# Patient Record
Sex: Female | Born: 1975 | Race: Black or African American | Hispanic: No | Marital: Single | Smoking: Former smoker
Health system: Southern US, Community
[De-identification: ages and names within clinical notes are randomized; demographics above are authoritative.]

## PROBLEM LIST (undated history)

## (undated) DIAGNOSIS — J45909 Unspecified asthma, uncomplicated: Secondary | ICD-10-CM

## (undated) DIAGNOSIS — IMO0002 Reserved for concepts with insufficient information to code with codable children: Secondary | ICD-10-CM

## (undated) DIAGNOSIS — I10 Essential (primary) hypertension: Secondary | ICD-10-CM

## (undated) DIAGNOSIS — I8390 Asymptomatic varicose veins of unspecified lower extremity: Secondary | ICD-10-CM

## (undated) DIAGNOSIS — R51 Headache: Secondary | ICD-10-CM

## (undated) HISTORY — DX: Reserved for concepts with insufficient information to code with codable children: IMO0002

## (undated) HISTORY — DX: Essential (primary) hypertension: I10

## (undated) HISTORY — PX: GANGLION CYST EXCISION: SHX1691

## (undated) HISTORY — PX: ABDOMINAL HYSTERECTOMY: SHX81

## (undated) HISTORY — DX: Asymptomatic varicose veins of unspecified lower extremity: I83.90

---

## 2011-10-16 ENCOUNTER — Other Ambulatory Visit: Payer: Self-pay

## 2012-10-15 DIAGNOSIS — R87619 Unspecified abnormal cytological findings in specimens from cervix uteri: Secondary | ICD-10-CM

## 2012-10-15 DIAGNOSIS — IMO0002 Reserved for concepts with insufficient information to code with codable children: Secondary | ICD-10-CM

## 2012-10-15 HISTORY — DX: Unspecified abnormal cytological findings in specimens from cervix uteri: R87.619

## 2012-10-15 HISTORY — DX: Reserved for concepts with insufficient information to code with codable children: IMO0002

## 2012-11-06 ENCOUNTER — Other Ambulatory Visit (HOSPITAL_COMMUNITY): Payer: Self-pay | Admitting: Obstetrics and Gynecology

## 2012-11-06 DIAGNOSIS — O09529 Supervision of elderly multigravida, unspecified trimester: Secondary | ICD-10-CM

## 2012-12-01 ENCOUNTER — Encounter (HOSPITAL_COMMUNITY): Payer: Self-pay | Admitting: Obstetrics and Gynecology

## 2012-12-08 ENCOUNTER — Ambulatory Visit (HOSPITAL_COMMUNITY)
Admission: RE | Admit: 2012-12-08 | Discharge: 2012-12-08 | Disposition: A | Discharge status: Home / Self Care | Payer: Medicaid Other | Source: Ambulatory Visit | Attending: Obstetrics and Gynecology | Admitting: Obstetrics and Gynecology

## 2012-12-08 ENCOUNTER — Encounter (HOSPITAL_COMMUNITY): Payer: Self-pay

## 2012-12-08 ENCOUNTER — Other Ambulatory Visit (HOSPITAL_COMMUNITY): Payer: Self-pay | Admitting: Obstetrics and Gynecology

## 2012-12-08 VITALS — BP 128/79 | HR 79 | Wt 242.8 lb

## 2012-12-08 DIAGNOSIS — O09522 Supervision of elderly multigravida, second trimester: Secondary | ICD-10-CM

## 2012-12-08 DIAGNOSIS — O09529 Supervision of elderly multigravida, unspecified trimester: Secondary | ICD-10-CM | POA: Insufficient documentation

## 2012-12-08 DIAGNOSIS — O358XX0 Maternal care for other (suspected) fetal abnormality and damage, not applicable or unspecified: Secondary | ICD-10-CM | POA: Insufficient documentation

## 2012-12-08 DIAGNOSIS — O10019 Pre-existing essential hypertension complicating pregnancy, unspecified trimester: Secondary | ICD-10-CM | POA: Insufficient documentation

## 2012-12-08 DIAGNOSIS — Z363 Encounter for antenatal screening for malformations: Secondary | ICD-10-CM | POA: Insufficient documentation

## 2012-12-08 DIAGNOSIS — Z1389 Encounter for screening for other disorder: Secondary | ICD-10-CM | POA: Insufficient documentation

## 2012-12-08 NOTE — Progress Notes (Addendum)
Genetic Counseling  High-Risk Gestation Note  Appointment Date:  12/08/2012 Referred By: Earleen Newport, MD Date of Birth:  05-17-1976    Pregnancy History: [id number] Estimated Date of Delivery: 05/10/13 Estimated Gestational Age: [redacted]w[redacted]d Attending: Alpha Gula, MD   Ms. Alexandra Murphy and her partner, Mr. Alexandra Murphy, were seen for genetic counseling because of a maternal age of 37 y.o.Marland Kitchen     They were counseled regarding maternal age and the association with risk for chromosome conditions due to nondisjunction with aging of the ova.   We reviewed chromosomes, nondisjunction, and the associated 1 in 28 risk for fetal aneuploidy at [redacted]w[redacted]d gestation related to a maternal age of 37 y.o. at delivery.  They were counseled that the risk for aneuploidy decreases as gestational age increases, accounting for those pregnancies which spontaneously abort.  We specifically discussed Down syndrome (trisomy 62), trisomies 44 and 68, and sex chromosome aneuploidies (47,XXX and 47,XXY) including the common features and prognoses of each.   We reviewed the results of noninvasive prenatal testing (NIPT), which had previously been performed through the patient's OB office. We reviewed that the results of the patient's NIPT ([facility]) are within normal limits, showing a less than 1 in 10,000 risk for trisomies 21, 18 and 13. We reviewed that this testing identifies > 99% of pregnancies with trisomy 21, >98% of pregnancies with trisomy 15, and >80% with trisomy 21; the false positive rate is <0.1% for all conditions. This estimate provides a pregnancy specific risk assessment.  Specifically, we discussed that NIPT analyzes cell free fetal DNA found in the maternal circulation. This test is not diagnostic for chromosome conditions, but can provide information regarding the presence or absence of extra fetal DNA for chromosomes 13, 18, and 21. Thus, it would not identify or rule out all genetic conditions.   We reviewed the  additional screening option of targeted ultrasound. We discussed that ~50-80% of fetuses with Down syndrome and up to 90-95% of fetuses with trisomy 18/13, when well visualized, have detectable anomalies or soft markers by detailed ultrasound (~18+ weeks gestation). Ultrasound performed at the time of today's visit visualized an echogenic intracardiac focus (EIF).  A complete ultrasound was performed today.  The ultrasound report will be sent under separate cover. An isolated echogenic focus is generally believed to be a normal variation without any concerns for the pregnancy.  Isolated echogenic cardiac foci are not associated with congenital heart defects in the baby or compromised cardiac function after birth.  However, an echogenic cardiac focus is associated with a slightly increased chance for Down syndrome in the pregnancy. Thus, the presence of an EIF would increase the risk for Down syndrome above the patient's cell free fetal DNA screen result of less than 1 in 10,000 to less than 1 in 5,000, which is still less than the patient's a priori age-related risk.   They were also counseled regarding diagnostic testing via amniocentesis.  We reviewed the approximate 1 in 300-500 risk for complications for amniocentesis, including spontaneous pregnancy loss. We discussed the risks, limitations, and benefits of each screening and testing option. After consideration of all the options, Ms. Alexandra Murphy declined amniocentesis in pregnancy, stating that she is comfortable with the risk assessment provided by NIPT and targeted ultrasound. She understands that ultrasound cannot rule out all birth defects or genetic syndromes. The patient was advised of this limitation and states she still does not want diagnostic testing at this time or in the future given the associated risk of  complications.   Ms. Alexandra Murphy was provided with written information regarding sickle cell anemia (SCA) including the carrier  frequency and incidence in the African-American population, the availability of carrier testing and prenatal diagnosis if indicated.  In addition, we discussed that hemoglobinopathies are routinely screened for as part of the [place] newborn screening panel.  She declined hemoglobin electrophoresis and additional discussion regarding screening today.  Both family histories were reviewed and found to be noncontributory for birth defects, mental retardation, and known genetic conditions. Without further information regarding the provided family history, an accurate genetic risk cannot be calculated. Further genetic counseling is warranted if more information is obtained.  Ms. Alexandra Murphy denied exposure to environmental toxins or chemical agents. She denied the use of alcohol or street drugs. She reported smoking approximately 2-3 cigarettes per day. The associations of smoking in pregnancy were reviewed and cessation encouraged.  She denied significant viral illnesses during the course of her pregnancy. Her medical and surgical histories were contributory for hypertension, for which she reported taking amlodipine. Available animal study data have not indicated an anticipated increased risk for congenital anomalies above the general population risk with prenatal use of amlodipine. Human data are currently limited regarding use of amlodipine in pregnancy.  I counseled this couple regarding the above risks and available options.  The approximate face-to-face time with the genetic counselor was 30 minutes.  Quinn Plowman, MS,  Certified Genetic Counselor 12/08/2012

## 2012-12-08 NOTE — Progress Notes (Signed)
Alexandra Murphy  was seen today for an ultrasound appointment.  See full report in [facility].  Comments: [name] was seen today due to advanced maternal age.  She previously underwent NIPT that returned low risk for aneuploidy.  An echogenic focus was seen in the left cardiac ventricle.  This is felt to represent a calcified papillary muscle, and is not associated with structural or functional cardiac abnormalities.  Although an echogenic cardiac focus may be associated with an increased risk of Down syndrome, this risk is felt to be minimal and in light of a normal cell free fetal DNA, her risk is essentially negligible.  Impression: Single IUP at 18 1/7 weeks Echogenic intracardiac focus The remainder of the fetal anatomy was within normal limits.  Somewhat limited views of the fetal heart were obtained. No other markers associated witn aneuploidy seen Ultrasound measurements consistent with dates Normal amniotic fluid volume  Recommendations: Recommend follow-up ultrasound examination in 4 weeks to reevaluate the fetal heart.  Alpha Gula, MD

## 2013-01-05 ENCOUNTER — Ambulatory Visit (HOSPITAL_COMMUNITY)
Admission: RE | Admit: 2013-01-05 | Discharge: 2013-01-05 | Disposition: A | Discharge status: Home / Self Care | Payer: Medicaid Other | Source: Ambulatory Visit | Attending: Obstetrics and Gynecology | Admitting: Obstetrics and Gynecology

## 2013-01-05 ENCOUNTER — Other Ambulatory Visit (HOSPITAL_COMMUNITY): Payer: Self-pay | Admitting: Maternal and Fetal Medicine

## 2013-01-05 VITALS — BP 123/67 | HR 82 | Wt 247.0 lb

## 2013-01-05 DIAGNOSIS — O358XX0 Maternal care for other (suspected) fetal abnormality and damage, not applicable or unspecified: Secondary | ICD-10-CM | POA: Insufficient documentation

## 2013-01-05 DIAGNOSIS — O09522 Supervision of elderly multigravida, second trimester: Secondary | ICD-10-CM

## 2013-01-05 DIAGNOSIS — O10019 Pre-existing essential hypertension complicating pregnancy, unspecified trimester: Secondary | ICD-10-CM | POA: Insufficient documentation

## 2013-01-05 DIAGNOSIS — O26879 Cervical shortening, unspecified trimester: Secondary | ICD-10-CM | POA: Insufficient documentation

## 2013-01-05 DIAGNOSIS — O26872 Cervical shortening, second trimester: Secondary | ICD-10-CM

## 2013-01-05 DIAGNOSIS — O09529 Supervision of elderly multigravida, unspecified trimester: Secondary | ICD-10-CM | POA: Insufficient documentation

## 2013-01-05 NOTE — Progress Notes (Signed)
Alexandra Murphy  was seen today for an ultrasound appointment.  See full report in [facility].  Alpha Gula, MD

## 2013-01-12 ENCOUNTER — Encounter (HOSPITAL_COMMUNITY): Payer: Self-pay | Admitting: *Deleted

## 2013-01-12 ENCOUNTER — Ambulatory Visit (HOSPITAL_COMMUNITY)
Admission: RE | Admit: 2013-01-12 | Discharge: 2013-01-12 | Disposition: A | Discharge status: Home / Self Care | Payer: Medicaid Other | Source: Ambulatory Visit | Attending: Obstetrics and Gynecology | Admitting: Obstetrics and Gynecology

## 2013-01-12 ENCOUNTER — Inpatient Hospital Stay (HOSPITAL_COMMUNITY)
Admission: AD | Admit: 2013-01-12 | Discharge: 2013-01-12 | Disposition: A | Discharge status: Home / Self Care | Payer: Medicaid Other | Source: Ambulatory Visit | Attending: Obstetrics & Gynecology | Admitting: Obstetrics & Gynecology

## 2013-01-12 DIAGNOSIS — O09522 Supervision of elderly multigravida, second trimester: Secondary | ICD-10-CM

## 2013-01-12 DIAGNOSIS — O479 False labor, unspecified: Secondary | ICD-10-CM

## 2013-01-12 DIAGNOSIS — O26872 Cervical shortening, second trimester: Secondary | ICD-10-CM

## 2013-01-12 DIAGNOSIS — O26879 Cervical shortening, unspecified trimester: Secondary | ICD-10-CM | POA: Insufficient documentation

## 2013-01-12 DIAGNOSIS — O358XX0 Maternal care for other (suspected) fetal abnormality and damage, not applicable or unspecified: Secondary | ICD-10-CM | POA: Insufficient documentation

## 2013-01-12 DIAGNOSIS — O10019 Pre-existing essential hypertension complicating pregnancy, unspecified trimester: Secondary | ICD-10-CM | POA: Insufficient documentation

## 2013-01-12 DIAGNOSIS — O09529 Supervision of elderly multigravida, unspecified trimester: Secondary | ICD-10-CM | POA: Insufficient documentation

## 2013-01-12 DIAGNOSIS — O4700 False labor before 37 completed weeks of gestation, unspecified trimester: Secondary | ICD-10-CM | POA: Insufficient documentation

## 2013-01-12 HISTORY — DX: Headache: R51

## 2013-01-12 HISTORY — DX: Unspecified asthma, uncomplicated: J45.909

## 2013-01-12 MED ORDER — BETAMETHASONE SOD PHOS & ACET 6 (3-3) MG/ML IJ SUSP
12.0000 mg | Freq: Once | INTRAMUSCULAR | Status: AC
  Administered 2013-01-12: 12 mg via INTRAMUSCULAR
  Filled 2013-01-12: qty 2

## 2013-01-12 NOTE — MAU Provider Note (Signed)
  History     CSN: [id number]  Arrival date and time: 01/12/13 1622   None     Chief Complaint  Patient presents with  . Labor Eval   HPI Ms Daffron is a 37yo [id number] at 23.1wks who presents from MFM for further eval of shortened cx. She was seen today with noted cx length of 0.4cm via [place]. Discussion re possible care plans were had with the pt and then she was sent to MAU to eval for ctx/changing cx. Rec'd first dose of Bmeth while in MFM.  Denies pain, leak or bldg. Her OB care is provided by Dr Ferdinand Cava in [facility].  OB History   Grav Para Term Preterm Abortions TAB SAB Ect Mult Living   5 2 2  0 2 0 2 0 0 2      Past Medical History  Diagnosis Date  . Headache   . Asthma     Past Surgical History  Procedure Laterality Date  . Ganglion cyst excision      Family History  Problem Relation Age of Onset  . Hypertension Mother   . Hypertension Maternal Grandmother     History  Substance Use Topics  . Smoking status: Current Every Day Smoker -- 0.25 packs/day  . Smokeless tobacco: Not on file  . Alcohol Use: No    Allergies: No Known Allergies  Prescriptions prior to admission  Medication Sig Dispense Refill  . acetaminophen (TYLENOL) 325 MG tablet Take 650 mg by mouth every 6 (six) hours as needed for pain (For headache.).      Marland Kitchen albuterol (PROVENTIL HFA;VENTOLIN HFA) 108 (90 BASE) MCG/ACT inhaler Inhale 2 puffs into the lungs every 6 (six) hours as needed for wheezing or shortness of breath.      Marland Kitchen amLODipine (NORVASC) 5 MG tablet Take 5 mg by mouth daily.      Marland Kitchen amoxicillin (AMOXIL) 875 MG tablet Take 875 mg by mouth daily.      . Prenatal Vit-Fe Fumarate-FA (PRENATAL MULTIVITAMIN) TABS Take 1 tablet by mouth daily at 12 noon.        ROS Physical Exam   Blood pressure 140/76, pulse 78, temperature 98.4 F (36.9 C), resp. rate 20, height 5' 7.5" (1.715 m), weight 246 lb (111.585 kg), last menstrual period 08/03/2012.  Physical Exam  Constitutional: She is  oriented to person, place, and time. She appears well-developed.  HENT:  Head: Normocephalic.  Cardiovascular: Normal rate.   Respiratory: Effort normal.  GI:  FHR 140's +accels, no decels No ctx per toco  Genitourinary: Vagina normal.  Cx closed/50%/high  Musculoskeletal: Normal range of motion.  Neurological: She is alert and oriented to person, place, and time.  Skin: Skin is warm and dry.  Psychiatric: She has a normal mood and affect. Her behavior is normal. Thought content normal.     MAU Course  Procedures    Assessment and Plan  IUP at 23.1wks Shortened cx  D/C home with follow up (including second dose of Bmeth) tomorrow with Dr Ferdinand Cava Note given to be out of work  [name] 01/12/2013, 6:33 PM

## 2013-01-12 NOTE — MAU Note (Signed)
Sent from MFM for shortened cervix and ? Preterm labor;

## 2013-01-12 NOTE — Progress Notes (Signed)
[facility]  Indication: 37 yr old [id number] at [redacted]w[redacted]d with short cervix for follow up cervical length.  Findings: 1. Single intrauterine pregnancy. 2. Posterior placenta without evidence of previa. 3. Normal amniotic fluid volume. 4. Shortened cervical length of 0.4cm.  Recommendations: 1. Shortened cervical length: - previously counseled - I discussed the increased risk of preterm labor/PPROM and preterm delivery especially with a cervical length around 0.4cm. Given this increased risk I recommend that the patient receive a course of betamethasone to decrease risks of prematurity. She received the first dose today and will go to [facility] tomorrow to receive the second dose. I explained briefly the survival rates and morbidity rates of a fetus born at 22-[redacted] weeks gestation but would also recommend a formal Neonatology consult to help patient decide when she would want full intervention for the fetus- at that time I would recommend inpatient management until at least 28 weeks then reevaluation of cervical length and if stable can consider outpatient management.  I explained utility of betamethasone. I recommend the patient be evaluated for preterm labor. Evaluation for contractions should take place. Patient was sent to [facility] for evaluation. If stable patient wishes to go home and discuss with her family when she would want inpatient management and she will discuss this decision with her primary OB Dr. Ferdinand Cava. I would recommend strict preterm labor precautions and follow up within a week for cervical length if patient remains an outpatient.  If patient is regularly contracting and changing her cervix then recommend tocolysis with either indomethacin or nifedipine. If delivery is imminent recommend magnesium sulfate for neuroprotection. Load of 4-6 grams IV over 20 minutes followed by 2 grams/hr for 12 hours. If not delivered discontinue magnesium sulfate.   Results were given to Dr. Ferdinand Cava and the patient was sent to labor and delivery. 2. Advanced maternal age: - previously counseled - had normal cell free fetal DNA screening  Eulis Foster, MD

## 2013-01-12 NOTE — MAU Provider Note (Signed)
Attestation of Attending Supervision of Advanced Practitioner (PA/CNM/NP): Evaluation and management procedures were performed by the Advanced Practitioner under my supervision and collaboration.  I have reviewed the Advanced Practitioner's note and chart, and I agree with the management and plan.  Felina Tello, MD, FACOG Attending Obstetrician & Gynecologist Faculty Practice, [facility]  

## 2013-01-20 ENCOUNTER — Encounter: Payer: Self-pay | Admitting: *Deleted

## 2013-01-21 ENCOUNTER — Other Ambulatory Visit: Payer: Self-pay | Admitting: Obstetrics & Gynecology

## 2013-01-21 ENCOUNTER — Ambulatory Visit (INDEPENDENT_AMBULATORY_CARE_PROVIDER_SITE_OTHER): Payer: Medicaid Other | Admitting: Obstetrics & Gynecology

## 2013-01-21 ENCOUNTER — Encounter: Payer: Self-pay | Admitting: Obstetrics & Gynecology

## 2013-01-21 VITALS — BP 110/71 | Temp 99.1°F | Wt 250.1 lb

## 2013-01-21 DIAGNOSIS — O09529 Supervision of elderly multigravida, unspecified trimester: Secondary | ICD-10-CM

## 2013-01-21 DIAGNOSIS — O26872 Cervical shortening, second trimester: Secondary | ICD-10-CM

## 2013-01-21 MED ORDER — FLUCONAZOLE 150 MG PO TABS: 150.0000 mg | ORAL_TABLET | Freq: Once | ORAL | Status: DC

## 2013-01-21 NOTE — Patient Instructions (Addendum)

## 2013-01-21 NOTE — Progress Notes (Signed)
Pulse- 79 New ob packet given  Weight gain of 11-20lbs

## 2013-01-21 NOTE — Progress Notes (Signed)
Transfer from [facility] for short cervix. Vaginal itch, no D/C Records and [place] reports reviewed Wet prep done, suspect yeast , Rx Diflucan Preterm labor precautions, continue Prometrium  Adam Phenix, MD

## 2013-01-22 LAB — WET PREP, GENITAL

## 2013-01-25 ENCOUNTER — Encounter (HOSPITAL_COMMUNITY): Payer: Self-pay

## 2013-01-25 ENCOUNTER — Inpatient Hospital Stay (HOSPITAL_COMMUNITY)
Admission: AD | Admit: 2013-01-25 | Discharge: 2013-02-15 | DRG: 781 | Disposition: A | Discharge status: Home / Self Care | Payer: Medicaid Other | Source: Ambulatory Visit | Attending: Obstetrics & Gynecology | Admitting: Obstetrics & Gynecology

## 2013-01-25 ENCOUNTER — Encounter (HOSPITAL_COMMUNITY): Payer: Self-pay | Admitting: *Deleted

## 2013-01-25 ENCOUNTER — Ambulatory Visit (HOSPITAL_COMMUNITY)
Admission: RE | Admit: 2013-01-25 | Discharge: 2013-01-25 | Disposition: A | Discharge status: Home / Self Care | Payer: Medicaid Other | Source: Ambulatory Visit | Attending: Obstetrics & Gynecology | Admitting: Obstetrics & Gynecology

## 2013-01-25 VITALS — BP 121/68 | HR 79 | Wt 249.0 lb

## 2013-01-25 DIAGNOSIS — O10019 Pre-existing essential hypertension complicating pregnancy, unspecified trimester: Secondary | ICD-10-CM

## 2013-01-25 DIAGNOSIS — O321XX0 Maternal care for breech presentation, not applicable or unspecified: Secondary | ICD-10-CM | POA: Diagnosis present

## 2013-01-25 DIAGNOSIS — O09529 Supervision of elderly multigravida, unspecified trimester: Secondary | ICD-10-CM

## 2013-01-25 DIAGNOSIS — O26873 Cervical shortening, third trimester: Secondary | ICD-10-CM

## 2013-01-25 DIAGNOSIS — O26879 Cervical shortening, unspecified trimester: Principal | ICD-10-CM

## 2013-01-25 DIAGNOSIS — Z3689 Encounter for other specified antenatal screening: Secondary | ICD-10-CM | POA: Insufficient documentation

## 2013-01-25 DIAGNOSIS — O10912 Unspecified pre-existing hypertension complicating pregnancy, second trimester: Secondary | ICD-10-CM

## 2013-01-25 DIAGNOSIS — O10913 Unspecified pre-existing hypertension complicating pregnancy, third trimester: Secondary | ICD-10-CM

## 2013-01-25 DIAGNOSIS — O10919 Unspecified pre-existing hypertension complicating pregnancy, unspecified trimester: Secondary | ICD-10-CM

## 2013-01-25 DIAGNOSIS — O09522 Supervision of elderly multigravida, second trimester: Secondary | ICD-10-CM

## 2013-01-25 DIAGNOSIS — O26872 Cervical shortening, second trimester: Secondary | ICD-10-CM

## 2013-01-25 MED ORDER — ALBUTEROL SULFATE HFA 108 (90 BASE) MCG/ACT IN AERS
2.0000 | INHALATION_SPRAY | Freq: Four times a day (QID) | RESPIRATORY_TRACT | Status: DC | PRN
  Administered 2013-02-03 – 2013-02-04 (×4): 2 via RESPIRATORY_TRACT
  Filled 2013-01-25: qty 6.7

## 2013-01-25 MED ORDER — ZOLPIDEM TARTRATE 5 MG PO TABS
5.0000 mg | ORAL_TABLET | Freq: Every evening | ORAL | Status: DC | PRN
  Administered 2013-01-25 – 2013-02-14 (×18): 5 mg via ORAL
  Filled 2013-01-25 (×18): qty 1

## 2013-01-25 MED ORDER — DOCUSATE SODIUM 100 MG PO CAPS
100.0000 mg | ORAL_CAPSULE | Freq: Every day | ORAL | Status: DC
  Administered 2013-01-25 – 2013-01-31 (×7): 100 mg via ORAL
  Filled 2013-01-25 (×7): qty 1

## 2013-01-25 MED ORDER — PRENATAL MULTIVITAMIN CH
1.0000 | ORAL_TABLET | Freq: Every day | ORAL | Status: DC
  Administered 2013-01-25 – 2013-02-15 (×22): 1 via ORAL
  Filled 2013-01-25 (×21): qty 1

## 2013-01-25 MED ORDER — PROGESTERONE MICRONIZED 200 MG PO CAPS
200.0000 mg | ORAL_CAPSULE | Freq: Every day | ORAL | Status: DC
  Administered 2013-01-25 – 2013-02-14 (×21): 200 mg via VAGINAL
  Filled 2013-01-25 (×22): qty 1

## 2013-01-25 MED ORDER — ACETAMINOPHEN 325 MG PO TABS
650.0000 mg | ORAL_TABLET | ORAL | Status: DC | PRN
  Administered 2013-01-25 – 2013-02-14 (×12): 650 mg via ORAL
  Filled 2013-01-25 (×12): qty 2

## 2013-01-25 MED ORDER — AMLODIPINE BESYLATE 5 MG PO TABS
5.0000 mg | ORAL_TABLET | Freq: Every day | ORAL | Status: DC
  Administered 2013-01-26 – 2013-02-15 (×21): 5 mg via ORAL
  Filled 2013-01-25 (×22): qty 1

## 2013-01-25 MED ORDER — CALCIUM CARBONATE ANTACID 500 MG PO CHEW: 2.0000 | CHEWABLE_TABLET | ORAL | Status: DC | PRN

## 2013-01-25 NOTE — Consult Note (Signed)
Neonatology Consult to Antenatal Patient: 01/25/2013 10:48 PM    Requested by Dr Macon Large to consult with this patient at [redacted] weeks gestation.  Alexandra Murphy is a 36y/o G5P2 with [facility] admitted last 5/8 for shortened cervix and funneling of membranes with a posterior placenta but no previa and breech presentation.  She received a course of steroids at [redacted] weeks gestation and is being followed by Maternal-Fetal Medicine.  I spoke with Alexandra Murphy in Room 151.   I discussed in detail what to expect in case of possible delivery of the infant in the next few days including DR management, Morbidity and Mortality, possible respiratory complication (including surfactant therapy) and need for support, IV access, sepsis work-up, feeding (BF or formula), length of stay and long-term outcome.  She had a number of questions, which I answered.   I offered a NICU tour to any interested family members and would be glad to come back if she has more questions later.  Thank you for asking me to see this patient.   Overton Mam, MD (Attending Neonatologist)  Time spent face to face :   20 minutes

## 2013-01-25 NOTE — H&P (Signed)
Alexandra Murphy is a 37 y.o. female presenting for observation due to shortened cervix. Was seen by Dr Claudean Severance today and he recommended she be admitted.   Dr Fredda Hammed note: Alexandra Murphy returns for follow up due to shortened cervix. She was previously seen on 4/29 and noted to have a cervical length of 4 mm. She is currently on vaginal Prometrium. Following her evaluation then, she completed her first dose of betamethasone at [facility] and her second dose at [facility]. Since that time, she transferred care to the faculty practice. She denies uterine contractions, leakage of fluid or vaginal bleeding.  One exam today, U-shaped funneling is noted. There is almost no measurable cervical length. The fetus is in the breech presentation.    Maternal Medical History:  Reason for admission: Nausea.  Fetal activity: Perceived fetal activity is normal.   Last perceived fetal movement was within the past hour.      OB History   Grav Para Term Preterm Abortions TAB SAB Ect Mult Living   5 2 2  0 2 0 2 0 0 2     Past Medical History  Diagnosis Date  . Headache   . Asthma   . Hypertension   . Varicose veins   . Abnormal Pap smear 10/15/2012    [place]   Past Surgical History  Procedure Laterality Date  . Ganglion cyst excision     Family History: family history includes Hypertension in her maternal grandmother and mother. Social History:  reports that she has been smoking Cigarettes.  She has been smoking about 0.25 packs per day. She has never used smokeless tobacco. She reports that she does not drink alcohol or use illicit drugs.   Prenatal Transfer Tool  Maternal Diabetes: No Genetic Screening: Normal Maternal Ultrasounds/Referrals: Normal Fetal Ultrasounds or other Referrals:  None Maternal Substance Abuse:  No Significant Maternal Medications:  None Significant Maternal Lab Results:  None Other Comments:  None  Review of Systems  Constitutional:  Negative for fever, chills and malaise/fatigue.  Gastrointestinal: Negative for nausea, vomiting, abdominal pain, diarrhea and constipation.  Genitourinary: Negative for dysuria.      Blood pressure 101/46, pulse 76, temperature 98.4 F (36.9 C), temperature source Oral, resp. rate 20, height 5\' 7"  (1.702 m), weight 249 lb (112.946 kg), last menstrual period 08/03/2012. Exam Physical Exam  Prenatal labs: ABO, Rh:   Antibody:   Rubella:   RPR:    HBsAg:    HIV:    GBS:     Assessment/Plan: A:    Single IUP at 25 0/7 weeks  Shortened cervix with U-shaped funneling and almost no measurable cervix  Breech presentation   P:  Per Dr Claudean Severance: Recommendations:  Recommend inpatient observation - if patient is noted to have frequent uterine contractions, would entertain Indocin or Procardia tocolysis.  If delivery is felt to be imminent, would begin Magnesium sulfate for neuroprotection.  The patient completed a course of betamethasone 2 weeks ago - would consider a second course or betamethasone (single course)  NICU consult  Initial recommendations were for inpatient observation at least until 28 weeks and reevaluation of the cervical length at that time. Would at least observe as an inpatient for 1-2 weeks with reevaluation of the cervical length prior to hospital discharge.   Alexandra Murphy 01/25/2013, 7:10 PM   Attestation of Attending Supervision of Advanced Practitioner (PA/CNM/NP): Evaluation and management procedures were performed by the Advanced Practitioner under my supervision and collaboration.  I have reviewed  the Advanced Practitioner's note and chart, and I agree with the management and plan.  Jaynie Collins, MD, FACOG Attending Obstetrician & Gynecologist Faculty Practice, [facility]

## 2013-01-25 NOTE — Progress Notes (Signed)
Alexandra Murphy  was seen today for an ultrasound appointment.  See full report in [facility].  Comments: [name] returns for follow up due to shortened cervix.  She was previously seen on 4/29 and noted to have a cervical length of 4 mm. She is currently on vaginal Prometrium.  Following her evaluation then, she completed her first dose of betamethasone at [facility] and her second dose at [facility].   Since that time, she transferred care to the faculty practice.  She denies uterine contractions, leakage of fluid or vaginal bleeding.  One exam today, U-shaped funneling is noted.  There is almost no measurable cervical length.  The fetus is in the breech presentation.  Impression: Single IUP at 25 0/7 weeks Shortened cervix with U-shaped funneling and almost no measurable cervix Breech presentation  Recommendations: Recommend inpatient observation - if patient is noted to have frequent uterine contractions, would entertain Indocin or Procardia tocolysis.  If delivery is felt to be imminent, would begin Magnesium sulfate for neuroprotection.   The patient completed a course of betamethasone 2 weeks ago - would consider a second course or betamethasone (single course) NICU consult  Initial recommendations were for inpatient observation at least until 28 weeks and reevaluation of the cervical length at that time.  Would at least observe as an inpatient for 1-2 weeks with reevaluation of the cervical length prior to hospital discharge.  Findings and recommendations discussed with Dr. Shawnie Pons. Alpha Gula, MD

## 2013-01-26 DIAGNOSIS — O26879 Cervical shortening, unspecified trimester: Principal | ICD-10-CM

## 2013-01-26 LAB — CBC
HCT: 28 % — ABNORMAL LOW (ref 36.0–46.0)
Hemoglobin: 9.8 g/dL — ABNORMAL LOW (ref 12.0–15.0)
MCHC: 35 g/dL (ref 30.0–36.0)
MCV: 84.6 fL (ref 78.0–100.0)

## 2013-01-26 LAB — TYPE AND SCREEN: ABO/RH(D): A POS

## 2013-01-26 MED ORDER — BETAMETHASONE SOD PHOS & ACET 6 (3-3) MG/ML IJ SUSP
12.0000 mg | INTRAMUSCULAR | Status: AC
  Administered 2013-01-26 – 2013-01-27 (×2): 12 mg via INTRAMUSCULAR
  Filled 2013-01-26 (×2): qty 2

## 2013-01-26 NOTE — Progress Notes (Signed)
Pt just coming out from a shower

## 2013-01-26 NOTE — Progress Notes (Signed)
Patient ID: Alexandra Murphy, female   DOB: July 14, 1976, 37 y.o.   MRN: [id number] FACULTY PRACTICE ANTEPARTUM COMPREHENSIVE PROGRESS NOTE  Alexandra Murphy is a 37 y.o. [id number] at [redacted]w[redacted]d  who is admitted for shortened cervix.  Estimated Date of Delivery: 05/10/13 Fetal presentation is breech.  Length of Stay:  1 Days. 01/25/2013  Subjective: Pt reports no contractions, bleeding, loss of fluid. Baby moving well. Occasional mild headache, no vision changes. No RUQ pain.  Vitals:  Blood pressure 132/68, pulse 71, temperature 98.4 F (36.9 C), temperature source Oral, resp. rate 20, height 5\' 7"  (1.702 m), weight 112.946 kg (249 lb), last menstrual period 08/03/2012. Physical Examination: GEN:  WNWD, no distress ABD:  Non-tender Cervical Exam: Not evaluated.  Extremities: extremities normal, atraumatic, no cyanosis or edema  Membranes:intact  Fetal Monitoring:  Baseline: 140 bpm, Variability: Good {> 6 bpm), Accelerations: Reactive and Decelerations: Absent  Labs:  No results found for this or any previous visit (from the past 24 hour(s)).  Imaging Studies:    Sono on admission:  No appreciable cervix, U-shaped funneling, fetus in breech presentation   Medications:  Scheduled . amLODipine  5 mg Oral Daily  . docusate sodium  100 mg Oral Daily  . prenatal multivitamin  1 tablet Oral Q1200  . progesterone  200 mg Vaginal QHS   I have reviewed the patient's current medications.  ASSESSMENT: Patient Active Problem List   Diagnosis Date Noted  . Short cervix, antepartum 01/25/2013  . Elderly multigravida with antepartum condition or complication 12/08/2012    PLAN: Monitor for contractions for a few days, Mag if labor iminent S/p BMZ, consider repeat course Continue routine antenatal care.  Napoleon Form, MD

## 2013-01-26 NOTE — Progress Notes (Signed)
UR completed 

## 2013-01-27 ENCOUNTER — Encounter (HOSPITAL_COMMUNITY): Payer: Self-pay | Admitting: *Deleted

## 2013-01-27 NOTE — Progress Notes (Signed)
Patient ID: Alexandra Murphy, female   DOB: Jan 05, 1976, 37 y.o.   MRN: [id number]  FACULTY PRACTICE ANTEPARTUM COMPREHENSIVE PROGRESS NOTE  Coren Crownover is a 37 y.o. [id number] at [redacted]w[redacted]d  who is admitted for Short Cervix.  Estimated Date of Delivery: 05/10/13 Fetal presentation is breech.  Length of Stay:  2 Days. 01/25/2013  Subjective: Patient reports good fetal movement.  She reports no uterine contractions, no bleeding and no loss of fluid per vagina.  Vitals:  Blood pressure 119/60, pulse 78, temperature 97.8 F (36.6 C), temperature source Oral, resp. rate 18, height 5\' 7"  (1.702 m), weight 112.946 kg (249 lb), last menstrual period 08/03/2012. Physical Examination: GEN:  Alert, no distress CV:  RRR, no murmur RESP:  CTAB ABD:  Gravid, non-tender, no guarding/rebound Cervical Exam: Not evaluated.  Extremities: extremities normal, atraumatic, no cyanosis or edema  Membranes:intact  Fetal Monitoring:  Baseline: 135-140 bpm, Variability: Good {> 6 bpm), Accelerations: Reactive and Decelerations: occasional mild variable  Labs:  No results found for this or any previous visit (from the past 24 hour(s)).  Imaging Studies:    None new   Medications:  Scheduled . amLODipine  5 mg Oral Daily  . betamethasone acetate-betamethasone sodium phosphate  12 mg Intramuscular Q24H  . docusate sodium  100 mg Oral Daily  . prenatal multivitamin  1 tablet Oral Q1200  . progesterone  200 mg Vaginal QHS   I have reviewed the patient's current medications.  ASSESSMENT: Patient Active Problem List   Diagnosis Date Noted  . Short cervix, antepartum 01/25/2013  . Elderly multigravida with antepartum condition or complication 12/08/2012    PLAN: Monitor for signs of preterm labor, PPROM, continue prometrium Repeating betamethasone course Continue routine antenatal care. Plan initially was to keep in hospital till 28 weeks. Will discuss with MFM.  Napoleon Form, MD

## 2013-01-28 MED ORDER — POLYETHYLENE GLYCOL 3350 17 G PO PACK
17.0000 g | PACK | Freq: Every day | ORAL | Status: DC | PRN
  Administered 2013-01-28 – 2013-01-31 (×4): 17 g via ORAL
  Filled 2013-01-28 (×4): qty 1

## 2013-01-28 MED ORDER — NICOTINE 14 MG/24HR TD PT24
14.0000 mg | MEDICATED_PATCH | Freq: Every day | TRANSDERMAL | Status: DC
  Administered 2013-01-28 – 2013-02-06 (×10): 14 mg via TRANSDERMAL
  Filled 2013-01-28 (×20): qty 1

## 2013-01-28 NOTE — Progress Notes (Signed)
Patient ID: Mystic Labo, female   DOB: 03/19/1976, 37 y.o.   MRN: [id number]  FACULTY PRACTICE ANTEPARTUM COMPREHENSIVE PROGRESS NOTE  Alexandra Murphy is a 37 y.o. [id number] at [redacted]w[redacted]d  who is admitted for severely shortened cervix.  Estimated Date of Delivery: 05/10/13 Fetal presentation is breech as of most recent ultrasound.  Length of Stay:  3 Days. 01/25/2013  Subjective: Pt reports intermittent wheezing, craving for cigarettes. No other complaints. Patient reports good fetal movement.  She reports no uterine contractions, no bleeding and no loss of fluid per vagina.  Vitals:  Blood pressure 114/58, pulse 69, temperature 98 F (36.7 C), temperature source Oral, resp. rate 18, height 5\' 7"  (1.702 m), weight 112.946 kg (249 lb), last menstrual period 08/03/2012. Physical Examination: GEN:  Alert, no distress CV:  RRR, no murmur RESP:  CTAB ABD:  Non-tender Cervical Exam: Not evaluated.  Extremities: extremities normal, atraumatic, no cyanosis or edema  Membranes:intact  Fetal Monitoring:  Baseline: 140 bpm, Variability: Good {> 6 bpm), Accelerations: Reactive and Decelerations: Absent.   Last monitoring at 17:00 yesterday.  Labs:  No results found for this or any previous visit (from the past 24 hour(s)).  Imaging Studies:    No new  Medications:  Scheduled . amLODipine  5 mg Oral Daily  . docusate sodium  100 mg Oral Daily  . prenatal multivitamin  1 tablet Oral Q1200  . progesterone  200 mg Vaginal QHS   I have reviewed the patient's current medications.  ASSESSMENT: Patient Active Problem List   Diagnosis Date Noted  . Short cervix, antepartum 01/25/2013  . Elderly multigravida with antepartum condition or complication 12/08/2012    PLAN: FHTs reactive Monitor in hospital until 28 weeks per MFM, bed rest with bathroom privileges.  May recheck cervical length at that time Continue prometrium Albuterol prn wheezing Nicotine patch Continue routine antenatal  care.  Napoleon Form, MD

## 2013-01-29 DIAGNOSIS — O10919 Unspecified pre-existing hypertension complicating pregnancy, unspecified trimester: Secondary | ICD-10-CM
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
## 2013-01-29 NOTE — Progress Notes (Signed)
Patient ID: Alexandra Murphy, female   DOB: May 03, 1976, 36 y.o.   MRN: [id number]  FACULTY PRACTICE ANTEPARTUM COMPREHENSIVE PROGRESS NOTE  Alexandra Murphy is a 37 y.o. [id number] at [redacted]w[redacted]d  who is admitted for cervical insufficiency.  Estimated Date of Delivery: 05/10/13 Fetal presentation is breech by most recent ultrasound (day of admission)  Length of Stay:  4 Days. 01/25/2013  Subjective: Patient reports good fetal movement.  She reports no uterine contractions, no bleeding and no loss of fluid per vagina.  Vitals:  Blood pressure 129/75, pulse 89, temperature 98.9 F (37.2 C), temperature source Oral, resp. rate 18, height 5\' 7"  (1.702 m), weight 112.946 kg (249 lb), last menstrual period 08/03/2012. Physical Examination: GEN:  Alert, no distress CV:  RRR, no murmur RESP:  CTAB ABD:  Gravid, size appropriate for dates, non-tender Cervical Exam: Not evaluated.  Extremities: extremities normal, atraumatic, no cyanosis or edema  Membranes:intact  Fetal Monitoring:  Baseline: 150 bpm, Variability: Fair (1-6 bpm), Accelerations: Reactive and Decelerations: Absent  Labs:  Results for orders placed during the hospital encounter of 01/25/13 (from the past 24 hour(s))  TYPE AND SCREEN   Collection Time    01/29/13  6:00 AM      Result Value Range   ABO/RH(D) A POS     Antibody Screen NEG     Sample Expiration 02/01/2013      Imaging Studies:    No new imaging  Medications:  Scheduled . amLODipine  5 mg Oral Daily  . docusate sodium  100 mg Oral Daily  . nicotine  14 mg Transdermal Daily  . prenatal multivitamin  1 tablet Oral Q1200  . progesterone  200 mg Vaginal QHS   I have reviewed the patient's current medications.  ASSESSMENT: Patient Active Problem List   Diagnosis Date Noted  . Chronic hypertension complicating or reason for care during pregnancy 01/29/2013  . Short cervix, antepartum 01/25/2013  . Elderly multigravida with antepartum condition or complication  12/08/2012    PLAN: IUP at [redacted]w[redacted]d, NSTs reactive Blood pressures normal, continue norvasc Plan on continued inpatient observation for 1-2 weeks or until 28 weeks and reevaluate cervix prior to discharge. Mag if delivery imminent. Indocin or procardia if contracting. So far no signs of PTL. Continue routine antenatal care.  Napoleon Form, MD

## 2013-01-29 NOTE — Progress Notes (Signed)
UR completed 

## 2013-01-29 NOTE — Progress Notes (Signed)
Patient ID: Alexandra Murphy, female   DOB: 1976-07-24, 37 y.o.   MRN: [id number]  Digital cervical exam done.  2-3 cm / 100 % effaced / ballottable  Napoleon Form, MD

## 2013-01-30 NOTE — Progress Notes (Addendum)
Patient ID: Alexandra Murphy, female   DOB: 11/30/1975, 36 y.o.   MRN: [id number] Patient ID: Alexandra Murphy, female   DOB: 21-Dec-1975, 38 y.o.   MRN: [id number]  FACULTY PRACTICE ANTEPARTUM COMPREHENSIVE PROGRESS NOTE  Alexandra Murphy is a 36 y.o. [id number] at [redacted]w[redacted]d   who is admitted for cervical insufficiency.  Estimated Date of Delivery: 05/10/13 Fetal presentation is breech by most recent ultrasound (day of admission)  Length of Stay:  5 Days. 01/25/2013  Subjective: Patient reports good fetal movement.  She reports no uterine contractions, no bleeding and no loss of fluid per vagina.  Vitals:  Blood pressure 109/51, pulse 65, temperature 98.5 F (36.9 C), temperature source Oral, resp. rate 18, height 5\' 7"  (1.702 m), weight 249 lb (112.946 kg), last menstrual period 08/03/2012. Physical Examination: GEN:  Alert, no distress CV:  RRR, no murmur RESP:  CTAB ABD:  Gravid, size appropriate for dates, non-tender Cervical Exam: Not evaluated.  Extremities: extremities normal, atraumatic, no cyanosis or edema  Membranes:intact  Fetal Monitoring:  Baseline: 150 bpm, Variability: Fair (1-6 bpm), Accelerations: Reactive and Decelerations: Absent  Labs:  No results found for this or any previous visit (from the past 24 hour(s)).  Imaging Studies:    No new imaging  Medications:  Scheduled . amLODipine  5 mg Oral Daily  . docusate sodium  100 mg Oral Daily  . nicotine  14 mg Transdermal Daily  . prenatal multivitamin  1 tablet Oral Q1200  . progesterone  200 mg Vaginal QHS   I have reviewed the patient's current medications.  ASSESSMENT: Patient Active Problem List   Diagnosis Date Noted  . Chronic hypertension complicating or reason for care during pregnancy 01/29/2013  . Short cervix, antepartum 01/25/2013  . Elderly multigravida with antepartum condition or complication 12/08/2012    PLAN: IUP at [redacted]w[redacted]d, NSTs reactive Blood pressures normal, continue norvasc Plan on continued  inpatient observation for 1-2 weeks or until 28 weeks and reevaluate cervix prior to discharge. Mag if delivery imminent. Indocin or procardia if contracting. So far no signs of PTL. Continue routine antenatal care.  Jahmier Willadsen H 01/30/2013 7:29 AM

## 2013-01-31 DIAGNOSIS — O10019 Pre-existing essential hypertension complicating pregnancy, unspecified trimester: Secondary | ICD-10-CM

## 2013-01-31 NOTE — Progress Notes (Signed)
Patient ID: Alexandra Murphy, female   DOB: 1976-09-09, 36 y.o.   MRN: [id number]  CTSP for c/o damp underwear. No visible fluid seen leaking. SSE: mod white vag d/c; no pool; no fern  Continue current care.  [name] 01/31/2013 10:56 PM

## 2013-01-31 NOTE — Progress Notes (Signed)
Pt c/o noticing that her panties were staying a little more moist.  Pt denies any fluid running down her leg and denies that it is mucous like.  CNM notified and will discuss with MD for testing plan

## 2013-01-31 NOTE — Progress Notes (Signed)
Patient ID: Alexandra Murphy, female   DOB: [birth year], 37 y.o.   MRN: [id number] ACULTY PRACTICE ANTEPARTUM COMPREHENSIVE PROGRESS NOTE  Alexandra Murphy is a 37 y.o. [id number] at [redacted]w[redacted]d  who is admitted for cervical insufficiency and preterm cervical dilation.   Fetal presentation is breech. Length of Stay:  6  Days  Subjective: Pt without complaints.  No vaginal bleeding.  Felt that underwear were wet yesterday when she awakened but, has not had any leakage or wetness since that time.  Patient reports good fetal movement.  She reports no uterine contractions, no bleeding and no loss of fluid per vagina.  Vitals:  Blood pressure 137/68, pulse 81, temperature 98.5 F (36.9 C), temperature source Oral, resp. rate 18, height 5\' 7"  (1.702 m), weight 249 lb (112.946 kg), last menstrual period 08/03/2012. Physical Examination: General appearance - alert, well appearing, and in no distress Abdomen - soft, nontender, nondistended, no masses or organomegaly gravid Extremities - peripheral pulses normal, no pedal edema, no clubbing or cyanosis  Fetal Monitoring:  Baseline: 150 bpm, Variability: Good {> 6 bpm), Accelerations: Reactive and Decelerations: Absent  Labs:  No results found for this or any previous visit (from the past 24 hour(s)).  Imaging Studies:    n/a   Medications:  Scheduled . amLODipine  5 mg Oral Daily  . docusate sodium  100 mg Oral Daily  . nicotine  14 mg Transdermal Daily  . prenatal multivitamin  1 tablet Oral Q1200  . progesterone  200 mg Vaginal QHS   I have reviewed the patient's current medications.  ASSESSMENT: Patient Active Problem List   Diagnosis Date Noted  . Chronic hypertension complicating or reason for care during pregnancy 01/29/2013  . Short cervix, antepartum 01/25/2013  . Elderly multigravida with antepartum condition or complication 12/08/2012    PLAN: 1 hour GTT in am Continue routine antenatal care.   HARRAWAY-SMITH,  Miray Mancino 01/31/2013,7:56 AM

## 2013-02-01 LAB — TYPE AND SCREEN
ABO/RH(D): A POS
Antibody Screen: NEGATIVE

## 2013-02-01 LAB — GLUCOSE TOLERANCE, 1 HOUR: Glucose, 1 Hour GTT: 126 mg/dL (ref 70–140)

## 2013-02-01 MED ORDER — DOCUSATE SODIUM 100 MG PO CAPS
100.0000 mg | ORAL_CAPSULE | Freq: Two times a day (BID) | ORAL | Status: DC
  Administered 2013-02-01 – 2013-02-15 (×29): 100 mg via ORAL
  Filled 2013-02-01 (×30): qty 1

## 2013-02-01 MED ORDER — ENOXAPARIN SODIUM 40 MG/0.4ML ~~LOC~~ SOLN
40.0000 mg | SUBCUTANEOUS | Status: DC
  Administered 2013-02-01 – 2013-02-15 (×15): 40 mg via SUBCUTANEOUS
  Filled 2013-02-01 (×16): qty 0.4

## 2013-02-01 MED ORDER — ENOXAPARIN (LOVENOX) PATIENT EDUCATION KIT
PACK | Freq: Once | Status: DC
  Filled 2013-02-01: qty 1

## 2013-02-01 NOTE — Progress Notes (Signed)
Antenatal Nutrition Assessment:  Currently  [redacted] weeks gestation, with cervical insufficiency Height  67.5 inches Weight 249 lbs pre-pregnancy weight 249 lbs.Pre-pregnancy  BMI 38.5  IBW 138 Lbs  Total weight gain 0. Weight gain goals 11-20 Lbs Pt reports weight loss of 11 Lbs first trimester, due to morning sickness.  Has now regained back to pre-pregnancy weight Estimated needs: 22-2400 kcal/day, 82-92 grams protein/day, 2.5 liters fluid/day  Regular diet tolerated well, appetite good. Requests snacks. Diet order changed to antenatal to allow snacks TID if needed  Current diet prescription will provide for increased needs.  No abnormal nutrition related labs. 1 hour GTT 126  Nutrition Dx: Increased nutrient needs r/t pregnancy and fetal growth requirements aeb [redacted] weeks gestation.  No educational needs assessed at this time.  Elisabeth Cara M.Odis Luster LDN Neonatal Nutrition Support Specialist Pager [phone]

## 2013-02-01 NOTE — Progress Notes (Signed)
Ur chart review completed.  

## 2013-02-01 NOTE — Progress Notes (Signed)
Pt off the monitor after reassurring FHR  

## 2013-02-01 NOTE — Progress Notes (Signed)
RN at bedside discussing self injections of Lovonox. RN giving the first injection to pt and  pharmacy to send a lovonox teach kit to start with pt.

## 2013-02-01 NOTE — Progress Notes (Signed)
Patient ID: Alexandra Murphy, female   DOB: 1976/04/05, 37 y.o.   MRN: [id number]  FACULTY PRACTICE ANTEPARTUM COMPREHENSIVE PROGRESS NOTE  Alexandra Murphy is a 37 y.o. J4N8295 at [redacted]w[redacted]d  who is admitted for cervical insufficiency.  Estimated Date of Delivery: 05/10/13 Fetal presentation is breech on most recent ultrasound.  Length of Stay:  7 Days. 01/25/2013  Subjective: Pt had some vaginal dampness yesterday but fern negative. No itching/dysuria. Patient reports good fetal movement.  She reports no uterine contractions, no bleeding and no loss of fluid per vagina.  Vitals:  Blood pressure 121/53, pulse 102, temperature 98.5 F (36.9 C), temperature source Oral, resp. rate 18, height 5\' 7"  (1.702 m), weight 112.946 kg (249 lb), last menstrual period 08/03/2012. Physical Examination: GEN: alert, no distress CV:  RRR, no murmur RESP:  CTAB ABD:  Soft, non-tender, normal bowel sounds Cervical Exam: Not evaluated.  Extremities: extremities normal, atraumatic, no cyanosis or edema  Membranes:intact by fern last night  Fetal Monitoring:  Baseline: 145-150 bpm, Variability: Fair (1-6 bpm), Accelerations: Reactive, Decelerations: Absent and approrpiate for gestational age  Labs:  No results found for this or any previous visit (from the past 24 hour(s)).  Imaging Studies:    No new imaging   Medications:  Scheduled . amLODipine  5 mg Oral Daily  . docusate sodium  100 mg Oral Daily  . nicotine  14 mg Transdermal Daily  . prenatal multivitamin  1 tablet Oral Q1200  . progesterone  200 mg Vaginal QHS   I have reviewed the patient's current medications.  ASSESSMENT: Patient Active Problem List   Diagnosis Date Noted  . Chronic hypertension complicating or reason for care during pregnancy 01/29/2013  . Short cervix, antepartum 01/25/2013  . Elderly multigravida with antepartum condition or complication 12/08/2012    PLAN: 1 hour GTT today NSTs reactive, no contractions Fern  negative Continue routine antenatal care. Consider discharge home at 28 weeks if no labor and cervix stable. May reevaluate cervix prior to discharge. S/p 2 rounds of betamethasone. Mag if delivery imminent.  Napoleon Form, MD

## 2013-02-02 MED ORDER — LORATADINE 10 MG PO TABS
10.0000 mg | ORAL_TABLET | Freq: Every day | ORAL | Status: DC
  Administered 2013-02-02 – 2013-02-15 (×14): 10 mg via ORAL
  Filled 2013-02-02 (×15): qty 1

## 2013-02-02 NOTE — Progress Notes (Signed)
Patient ID: Alexandra Murphy, female DOB: 22-Apr-1976, 37 y.o. MRN: [id number]    FACULTY PRACTICE ANTEPARTUM COMPREHENSIVE PROGRESS NOTE  Alexandra Murphy is a 37 y.o. [id number] at [redacted]w[redacted]d who is admitted for cervical insufficiency. Estimated Date of Delivery: 05/10/13  Fetal presentation is breech on most recent ultrasound.   Length of Stay: 8 Days. 01/25/2013   Subjective:  Pt had some vaginal dampness yesterday but fern negative. No itching/dysuria.  Patient reports good fetal movement. She reports no uterine contractions, no bleeding and no loss of fluid per vagina.   Vitals:  Filed Vitals:   02/02/13 0012  BP: 110/58  Pulse: 78  Temp: 97.8 F (36.6 C)  Resp: 20    Physical Examination:  GEN: alert, no distress  CV: RRR, no murmur  RESP: CTAB ABD: Soft, non-tender, normal bowel sounds  Cervical Exam: Not evaluated.  Extremities: extremities normal, atraumatic, no cyanosis or edema  Membranes:intact by fern last night   Fetal Monitoring: Baseline: 145-150 bpm, Variability: Fair (1-6 bpm), Accelerations: Reactive, Decelerations: Absent and approrpiate for gestational age   Labs:  Results for orders placed during the hospital encounter of 01/25/13 (from the past 24 hour(s))  TYPE AND SCREEN     Status: None   Collection Time    02/01/13  8:00 AM      Result Value Range   ABO/RH(D) A POS     Antibody Screen NEG     Sample Expiration 02/04/2013    GLUCOSE TOLERANCE, 1 HOUR     Status: None   Collection Time    02/01/13  8:00 AM      Result Value Range   Glucose, 1 Hour GTT 126  70 - 140 mg/dL     Imaging Studies:  No new imaging   Medications: Scheduled  . amLODipine  5 mg Oral Daily  . docusate sodium  100 mg Oral BID  . enoxaparin (LOVENOX) injection  40 mg Subcutaneous Q24H  . enoxaparin   Does not apply Once  . nicotine  14 mg Transdermal Daily  . prenatal multivitamin  1 tablet Oral Q1200  . progesterone  200 mg Vaginal QHS    I have reviewed the patient's  current medications.    ASSESSMENT:   Patient Active Problem List    Diagnosis  Date Noted   .  Chronic hypertension complicating or reason for care during pregnancy  01/29/2013   .  Short cervix, antepartum  01/25/2013   .  Elderly multigravida with antepartum condition or complication  12/08/2012    PLAN:  1 hour GTT normal NSTs reactive, no contractions, no PPROM Continue routine antenatal care.  Consider discharge home at 28 weeks if no labor and cervix stable. May reevaluate cervix prior to discharge. S/p 2 rounds of betamethasone. Mag if delivery imminent.    Napoleon Form, MD

## 2013-02-02 NOTE — Progress Notes (Signed)
02/02/13 1200  Clinical Encounter Type  Visited With Patient  Visit Type Initial;Spiritual support;Social support  Referral From Nurse  Spiritual Encounters  Spiritual Needs Emotional;Prayer   Made lengthy initial visit to offer spiritual and emotional support.  Alexandra Murphy can preach!  She has strong self-awareness, gratitude, and practice of caring for others, a combination that ends up being very reflective and faithful.  She shared about her faith journey, worldview, service ethic, and personal growth and development.  Provided pastoral presence, listening, reflection, and affirmation, as well as prayer.  Will continue to follow for support.  [address] [place], [place] [phone]

## 2013-02-03 ENCOUNTER — Encounter (HOSPITAL_COMMUNITY): Payer: Self-pay | Admitting: *Deleted

## 2013-02-03 DIAGNOSIS — O1002 Pre-existing essential hypertension complicating childbirth: Secondary | ICD-10-CM

## 2013-02-03 LAB — OB RESULTS CONSOLE RPR: RPR: NONREACTIVE

## 2013-02-03 MED ORDER — ALBUTEROL SULFATE (5 MG/ML) 0.5% IN NEBU
2.5000 mg | INHALATION_SOLUTION | RESPIRATORY_TRACT | Status: DC | PRN
  Filled 2013-02-03: qty 0.5

## 2013-02-03 NOTE — Progress Notes (Signed)
Patient ID: Alexandra Murphy, female   DOB: 12-11-1975, 37 y.o.   MRN: [id number] FACULTY PRACTICE ANTEPARTUM(COMPREHENSIVE) NOTE  Alexandra Murphy is a 37 y.o. [id number] at [redacted]w[redacted]d by best clinical estimate who is admitted for Preterm labor.   Fetal presentation is breech. Length of Stay:  9  Days  Subjective: Doing well.  No complaints. Patient reports the fetal movement as active. Patient reports uterine contraction  activity as none. Patient reports  vaginal bleeding as none. Patient describes fluid per vagina as None.  Vitals:  Blood pressure 136/67, pulse 92, temperature 98.2 F (36.8 C), temperature source Oral, resp. rate 20, height 5\' 7"  (1.702 m), weight 252 lb 9.6 oz (114.579 kg), last menstrual period 08/03/2012. Physical Examination:  General appearance - alert, well appearing, and in no distress Abdomen - soft, nontender, nondistended, no masses or organomegaly Fundal Height:  size equals dates and non tender Pelvic Exam:  normal external genitalia, vulva, vagina, cervix, uterus and adnexa. Extremities: extremities normal, atraumatic, no cyanosis or edema and Homans sign is negative, no sign of DVT  Membranes:intact  Fetal Monitoring:  Baseline: 150 bpm, Variability: Good {> 6 bpm), Accelerations: Non-reactive but appropriate for gestational age and Decelerations: Absent   Medications:  Scheduled . amLODipine  5 mg Oral Daily  . docusate sodium  100 mg Oral BID  . enoxaparin (LOVENOX) injection  40 mg Subcutaneous Q24H  . enoxaparin   Does not apply Once  . loratadine  10 mg Oral Daily  . nicotine  14 mg Transdermal Daily  . prenatal multivitamin  1 tablet Oral Q1200  . progesterone  200 mg Vaginal QHS   I have reviewed the patient's current medications.  ASSESSMENT: Patient Active Problem List   Diagnosis Date Noted  . Chronic hypertension complicating or reason for care during pregnancy 01/29/2013  . Short cervix, antepartum 01/25/2013  . Elderly multigravida with  antepartum condition or complication 12/08/2012    PLAN: Plan for continued monitoring in-house until further gestational age.  Jawuan Robb S 02/03/2013,7:11 AM

## 2013-02-04 ENCOUNTER — Encounter: Payer: Medicaid Other | Admitting: Obstetrics & Gynecology

## 2013-02-04 LAB — TYPE AND SCREEN: Antibody Screen: NEGATIVE

## 2013-02-04 NOTE — Progress Notes (Signed)
Patient ID: Alexandra Murphy, female   DOB: 08-05-1976, 37 y.o.   MRN: [id number] FACULTY PRACTICE ANTEPARTUM(COMPREHENSIVE) NOTE  Alexandra Murphy is Murphy 37 y.o. [id number] at [redacted]w[redacted]d by best clinical estimate who is admitted for Preterm labor.   Fetal presentation is breech. Length of Stay:  10  Days  Subjective: Doing well.  No complaints. Patient reports the fetal movement as active. Patient reports uterine contraction  activity as none. Patient reports  vaginal bleeding as none. Patient describes fluid per vagina as None.  Vitals:  Blood pressure 109/53, pulse 70, temperature 98.4 F (36.9 C), temperature source Oral, resp. rate 20, height 5\' 7"  (1.702 m), weight 252 lb 9.6 oz (114.579 kg), last menstrual period 08/03/2012. Physical Examination:  General appearance - alert, well appearing, and in no distress Abdomen - soft, nontender, nondistended, no masses or organomegaly Fundal Height:  size equals dates and non tender Pelvic Exam:  normal external genitalia, vulva, vagina, cervix, uterus and adnexa. Extremities: extremities normal, atraumatic, no cyanosis or edema and Homans sign is negative, no sign of DVT . Superficial varicosities palpated on both calves, more prominent on right. Membranes:intact  Fetal Monitoring:  Baseline: 150 bpm, Variability: Good {> 6 bpm), Accelerations: Non-reactive but appropriate for gestational age and Decelerations: Absent   Medications:  Scheduled . amLODipine  5 mg Oral Daily  . docusate sodium  100 mg Oral BID  . enoxaparin (LOVENOX) injection  40 mg Subcutaneous Q24H  . enoxaparin   Does not apply Once  . loratadine  10 mg Oral Daily  . nicotine  14 mg Transdermal Daily  . prenatal multivitamin  1 tablet Oral Q1200  . progesterone  200 mg Vaginal QHS   I have reviewed the patient's current medications.  ASSESSMENT: Patient Active Problem List   Diagnosis Date Noted  . Chronic hypertension complicating or reason for care during pregnancy  01/29/2013  . Short cervix, antepartum 01/25/2013  . Elderly multigravida with antepartum condition or complication 12/08/2012    PLAN: No signs/symptoms of PTL or maternal/fetal distress Plan for continued monitoring in-house until further gestational age.  Alexandra Murphy 02/04/2013,9:31 AM

## 2013-02-05 NOTE — Progress Notes (Signed)
Patient ID: Alexandra Murphy, female   DOB: [birth year], 37 y.o.   MRN: [id number]  FACULTY PRACTICE ANTEPARTUM COMPREHENSIVE PROGRESS NOTE  Alexandra Murphy is a 37 y.o. [id number] at [redacted]w[redacted]d  who is admitted for cervical insufficiency.  Estimated Date of Delivery: 05/10/13 Fetal presentation is breech.  Length of Stay:  11 Days. 01/25/2013  Subjective: No complaints. Patient reports good fetal movement.  She reports no uterine contractions, no bleeding and no loss of fluid per vagina.  Vitals:  Blood pressure 133/70, pulse 72, temperature 98.4 F (36.9 C), temperature source Oral, resp. rate 18, height 5\' 7"  (1.702 m), weight 114.579 kg (252 lb 9.6 oz), last menstrual period 08/03/2012. Physical Examination: GEN:  Alert, no distress CV: RRR, no murmur RESP:  CTAB ABD: non-tender Cervical Exam: Not evaluated. and found to be  Extremities: extremities normal, atraumatic, no cyanosis or edema  Membranes:intact  Fetal Monitoring:  Baseline: 145 bpm, Variability: Fair (1-6 bpm), Accelerations: Reactive and Decelerations: Absent  Labs:  Results for orders placed during the hospital encounter of 01/25/13 (from the past 24 hour(s))  TYPE AND SCREEN   Collection Time    02/04/13  7:35 PM      Result Value Range   ABO/RH(D) A POS     Antibody Screen NEG     Sample Expiration 02/07/2013      Imaging Studies:    No new  Medications:  Scheduled . amLODipine  5 mg Oral Daily  . docusate sodium  100 mg Oral BID  . enoxaparin (LOVENOX) injection  40 mg Subcutaneous Q24H  . enoxaparin   Does not apply Once  . loratadine  10 mg Oral Daily  . nicotine  14 mg Transdermal Daily  . prenatal multivitamin  1 tablet Oral Q1200  . progesterone  200 mg Vaginal QHS   I have reviewed the patient's current medications.  ASSESSMENT: Patient Active Problem List   Diagnosis Date Noted  . Chronic hypertension complicating or reason for care during pregnancy 01/29/2013  . Short cervix, antepartum  01/25/2013  . Elderly multigravida with antepartum condition or complication 12/08/2012    PLAN: No signs/symptoms of PTL or maternal/fetal distress  Patient desires to go home if possible.  Will check cervix today and possibly get u/s. If stable may be able to be discharged.  Napoleon Form, MD 02/05/2013 7:10 AM

## 2013-02-06 NOTE — Progress Notes (Signed)
Patient ID: Alexandra Murphy, female   DOB: 03-18-1976, 37 y.o.   MRN: [id number] FACULTY PRACTICE ANTEPARTUM(COMPREHENSIVE) NOTE  Alexandra Murphy is a 37 y.o. [id number] at [redacted]w[redacted]d by LMP who is admitted for incompetent cervix .   Fetal presentation is unsure. Length of Stay:  12  Days  Subjective: NO contractions  Patient reports the fetal movement as active. Patient reports uterine contraction  activity as none. Patient reports  vaginal bleeding as none. Patient describes fluid per vagina as None.  Vitals:  Blood pressure 136/66, pulse 84, temperature 98.1 F (36.7 C), temperature source Oral, resp. rate 18, height 5\' 7"  (1.702 m), weight 252 lb 9.6 oz (114.579 kg), last menstrual period 08/03/2012. Physical Examination:  General appearance - alert, well appearing, and in no distress Heart - normal rate and regular rhythm Abdomen - soft, nontender, nondistended Fundal Height:  size equals dates Cervical Exam: Not evaluated.  Extremities: extremities normal, atraumatic, no cyanosis or edema and Homans sign is negative, no sign of DVT with DTRs 2+ bilaterally Membranes:intact  Fetal Monitoring:  Baseline: 150 bpm  Labs:  No results found for this or any previous visit (from the past 24 hour(s)).  Imaging Studies:      Currently EPIC will not allow sonographic studies to automatically populate into notes.  In the meantime, copy and paste results into note or free text.  Medications:  Scheduled . amLODipine  5 mg Oral Daily  . docusate sodium  100 mg Oral BID  . enoxaparin (LOVENOX) injection  40 mg Subcutaneous Q24H  . enoxaparin   Does not apply Once  . loratadine  10 mg Oral Daily  . nicotine  14 mg Transdermal Daily  . prenatal multivitamin  1 tablet Oral Q1200  . progesterone  200 mg Vaginal QHS   I have reviewed the patient's current medications.  ASSESSMENT: Patient Active Problem List   Diagnosis Date Noted  . Chronic hypertension complicating or reason for care during  pregnancy 01/29/2013  . Short cervix, antepartum 01/25/2013  . Elderly multigravida with antepartum condition or complication 12/08/2012    PLAN: Continue observation  ARNOLD,JAMES 02/06/2013,9:05 AM

## 2013-02-07 NOTE — Progress Notes (Signed)
Patient ID: Alexandra Murphy, female   DOB: [birth year], 37 y.o.   MRN: [id number]  FACULTY PRACTICE ANTEPARTUM COMPREHENSIVE PROGRESS NOTE  Alexandra Murphy is a 37 y.o. [id number] at [redacted]w[redacted]d  who is admitted for cervical insufficiency.  Estimated Date of Delivery: 05/10/13 Fetal presentation is breech.  Length of Stay:  13 Days. 01/25/2013  Subjective: Patient reports good fetal movement.  She reports no uterine contractions, no bleeding and no loss of fluid per vagina.  Vitals:  Blood pressure 119/93, pulse 86, temperature 99 F (37.2 C), temperature source Oral, resp. rate 20, height 5\' 7"  (1.702 m), weight 114.579 kg (252 lb 9.6 oz), last menstrual period 08/03/2012.  Physical Examination: GEN:  WNWD, no distress HEENT:  NCAT, EOMI, conjunctiva clear NECK:  Supple, non-tender, no thyromegaly, trachea midline CV: RRR, no murmur RESP:  CTAB ABD:  Soft, non-tender, no guarding or rebound, normal bowel sounds EXTREM:  Warm, well perfused, no edema or tenderness NEURO:  Alert, oriented, no focal deficits GU:  Deferred Membranes:intact  Fetal Monitoring:  Baseline: 140-145 bpm, Variability: Good {> 6 bpm), Accelerations: Reactive and Decelerations: Absent  Labs:  No results found for this or any previous visit (from the past 24 hour(s)).  Imaging Studies:    No new studies  Medications:  Scheduled . amLODipine  5 mg Oral Daily  . docusate sodium  100 mg Oral BID  . enoxaparin (LOVENOX) injection  40 mg Subcutaneous Q24H  . enoxaparin   Does not apply Once  . loratadine  10 mg Oral Daily  . nicotine  14 mg Transdermal Daily  . prenatal multivitamin  1 tablet Oral Q1200  . progesterone  200 mg Vaginal QHS   I have reviewed the patient's current medications.  ASSESSMENT: Patient Active Problem List   Diagnosis Date Noted  . Chronic hypertension complicating or reason for care during pregnancy 01/29/2013  . Short cervix, antepartum 01/25/2013  . Elderly multigravida with antepartum  condition or complication 12/08/2012    PLAN: Continue observation and routine antenatal care.

## 2013-02-08 LAB — TYPE AND SCREEN: Antibody Screen: NEGATIVE

## 2013-02-08 NOTE — Progress Notes (Addendum)
Patient ID: Alexandra Murphy, female   DOB: [birth year], 37 y.o.   MRN: [id number] FACULTY PRACTICE ANTEPARTUM(COMPREHENSIVE) NOTE  Dorianne Perret is a 37 y.o. [id number] at [redacted]w[redacted]d by LMP who is admitted for incompetent cervix .   Fetal presentation is unsure. Length of Stay:  14  Days  Subjective: Patient is without complaints this morning  Patient reports the fetal movement as active. Patient reports uterine contraction  activity as none. Patient reports  vaginal bleeding as none. Patient describes fluid per vagina as None.  Vitals:  Blood pressure 144/72, pulse 93, temperature 98.4 F (36.9 C), temperature source Oral, resp. rate 18, height 5\' 7"  (1.702 m), weight 252 lb 9.6 oz (114.579 kg), last menstrual period 08/03/2012. Physical Examination:  General appearance - alert, well appearing, and in no distress Heart - normal rate and regular rhythm Abdomen - soft, nontender, nondistended Fundal Height:  size equals dates Cervical Exam: Not evaluated.  Extremities: extremities normal, atraumatic, no cyanosis or edema and Homans sign is negative, no sign of DVT with DTRs 2+ bilaterally Membranes:intact  Fetal Monitoring:  Baseline: 150 bpm, Variability: Good {> 6 bpm), Accelerations: Non-reactive but appropriate for gestational age, Decelerations: Absent and Toco: no contractions  Labs:  Results for orders placed during the hospital encounter of 01/25/13 (from the past 24 hour(s))  TYPE AND SCREEN   Collection Time    02/08/13  4:20 AM      Result Value Range   ABO/RH(D) A POS     Antibody Screen NEG     Sample Expiration 02/11/2013      Imaging Studies:      Currently EPIC will not allow sonographic studies to automatically populate into notes.  In the meantime, copy and paste results into note or free text.  Medications:  Scheduled . amLODipine  5 mg Oral Daily  . docusate sodium  100 mg Oral BID  . enoxaparin (LOVENOX) injection  40 mg Subcutaneous Q24H  . enoxaparin   Does  not apply Once  . loratadine  10 mg Oral Daily  . nicotine  14 mg Transdermal Daily  . prenatal multivitamin  1 tablet Oral Q1200  . progesterone  200 mg Vaginal QHS   I have reviewed the patient's current medications.  ASSESSMENT: Patient Active Problem List   Diagnosis Date Noted  . Chronic hypertension complicating or reason for care during pregnancy 01/29/2013  . Short cervix, antepartum 01/25/2013  . Elderly multigravida with antepartum condition or complication 12/08/2012    PLAN: Continue current care Will plan with MFM repeat ultrasound this week D/C planning pending MFM evaluation  Ginia Rudell 02/08/2013,7:14 AM

## 2013-02-09 NOTE — Progress Notes (Signed)
Patient ID: Alexandra Murphy, female   DOB: 1976-07-14, 37 y.o.   MRN: [id number]  FACULTY PRACTICE ANTEPARTUM COMPREHENSIVE PROGRESS NOTE  Samarra Ridgely is a 37 y.o. [id number] at [redacted]w[redacted]d  who is admitted for cervical insufficiency.  Estimated Date of Delivery: 05/10/13 Fetal presentation is breech.  Length of Stay:  15 Days. 01/25/2013  Subjective: Patient reports good fetal movement.  She reports no uterine contractions, no bleeding and no loss of fluid per vagina.  Vitals:  Blood pressure 103/80, pulse 95, temperature 98.5 F (36.9 C), temperature source Oral, resp. rate 18, height 5\' 7"  (1.702 m), weight 114.579 kg (252 lb 9.6 oz), last menstrual period 08/03/2012. Physical Examination: GEN:  WNWD, no distress HEENT:  NCAT, EOMI, conjunctiva clear NECK:  Supple, non-tender, no thyromegaly, trachea midline CV: RRR, no murmur RESP:  CTAB ABD:  Soft, non-tender, no guarding or rebound, normal bowel sounds EXTREM:  Warm, well perfused, no edema or tenderness NEURO:  Alert, oriented, no focal deficits GU:  Deferred Membranes:intact  Fetal Monitoring:  Baseline: 145 bpm, Variability: Good {> 6 bpm), Accelerations: Reactive and Decelerations: Absent TOCO:  Subtle contractions on monitor periodically yesterday  Labs:  No results found for this or any previous visit (from the past 24 hour(s)).  Imaging Studies:    No new   Medications:  Scheduled . amLODipine  5 mg Oral Daily  . docusate sodium  100 mg Oral BID  . enoxaparin (LOVENOX) injection  40 mg Subcutaneous Q24H  . enoxaparin   Does not apply Once  . loratadine  10 mg Oral Daily  . nicotine  14 mg Transdermal Daily  . prenatal multivitamin  1 tablet Oral Q1200  . progesterone  200 mg Vaginal QHS   I have reviewed the patient's current medications.  ASSESSMENT: Patient Active Problem List   Diagnosis Date Noted  . Chronic hypertension complicating or reason for care during pregnancy 01/29/2013  . Short cervix, antepartum  01/25/2013  . Elderly multigravida with antepartum condition or complication 12/08/2012   PLAN:  Continue current care  Will plan with MFM repeat ultrasound this week  D/C planning pending MFM evaluation

## 2013-02-09 NOTE — Progress Notes (Signed)
MD notified that pt requesting to talk to her regarding her POC

## 2013-02-10 ENCOUNTER — Ambulatory Visit (HOSPITAL_COMMUNITY): Payer: Medicaid Other

## 2013-02-10 ENCOUNTER — Inpatient Hospital Stay (HOSPITAL_COMMUNITY): Payer: Medicaid Other

## 2013-02-10 NOTE — Progress Notes (Signed)
[facility] ultrasound  Indication: 36 yr old [id number] at [redacted]w[redacted]d with chronic hypertension and preterm cervical dilation for follow up ultrasound.  Findings: 1. Single intrauterine pregnancy. 2. Estimated fetal weigth is in the 50th%. 3. Posterior placenta without evidence of previa. 4. Normal amniotic fluid volume. 5. The limited anatomy survey is normal. 6. Fetus is in breech presentation.  Recommendations: 1. Appropriate fetal growth. 2. Hypertension: - patient is well controlled on amlodipine - recommend BP control - recommend close surveillance for the development of signs/symptoms of preeclampsia - recommend fetal growth every 4 weeks - recommend antenatal testing starting at [redacted] weeks gestation - recommend delivery by estimated due date but not prior to 38 weeks in the absence of other complications 3. Preterm cervical dilation: - previously counseled - s/p 2 courses of betamethasone - last cervical exam a week ago was 3cm per primary OB - recommend inpatient management until at least 28 weeks then can consider rechecking cervical exam and if no contractions and no cervical change can consider outpatient management with close follow up and modified activity; given advanced dilation and no symptoms and breech presentation would consider inpatient management until 30 weeks - preterm labor precautions - s/p NICU consult - if delivery imminent prior to 32 weeks would recommend magnesium sulfate for neuroprotection  Eulis Foster, MD

## 2013-02-10 NOTE — Progress Notes (Signed)
Ur chart review completed.  

## 2013-02-10 NOTE — Progress Notes (Signed)
Patient ID: Alexandra Murphy, female   DOB: 1976-02-15, 37 y.o.   MRN: [id number] FACULTY PRACTICE ANTEPARTUM COMPREHENSIVE PROGRESS NOTE  Alexandra Murphy is a 37 y.o. [id number] at [redacted]w[redacted]d who is admitted for cervical insufficiency. Estimated Date of Delivery: 05/10/13  Fetal presentation is breech.  Length of Stay: 16 Days. 01/25/2013  Subjective:  Patient reports good fetal movement. She reports no uterine contractions, no bleeding and no loss of fluid per vagina.  Vitals: Blood pressure 103/80, pulse 95, temperature 98.5 F (36.9 C), temperature source Oral, resp. rate 18, height 5\' 7"  (1.702 m), weight 114.579 kg (252 lb 9.6 oz), last menstrual period 08/03/2012.  Physical Examination:  GEN: WNWD, no distress  HEENT: NCAT, EOMI, conjunctiva clear  NECK: Supple, non-tender, no thyromegaly, trachea midline  CV: RRR, no murmur  RESP: CTAB  ABD: Soft, non-tender, no guarding or rebound, normal bowel sounds  EXTREM: Warm, well perfused, no edema or tenderness  NEURO: Alert, oriented, no focal deficits  GU: Deferred  Membranes:intact  Fetal Monitoring: Baseline: 145 bpm, Variability: Good {> 6 bpm), Accelerations: Reactive and Decelerations: Absent  TOCO: Subtle contractions on monitor periodically yesterday  Labs:  No results found for this or any previous visit (from the past 24 hour(s)).  Imaging Studies:  No new  Medications: Scheduled  .  amLODipine  5 mg  Oral  Daily   .  docusate sodium  100 mg  Oral  BID   .  enoxaparin (LOVENOX) injection  40 mg  Subcutaneous  Q24H   .  enoxaparin   Does not apply  Once   .  loratadine  10 mg  Oral  Daily   .  nicotine  14 mg  Transdermal  Daily   .  prenatal multivitamin  1 tablet  Oral  Q1200   .  progesterone  200 mg  Vaginal  QHS   I have reviewed the patient's current medications.  ASSESSMENT:  Patient Active Problem List    Diagnosis  Date Noted   .  Chronic hypertension complicating or reason for care during pregnancy  01/29/2013   .   Short cervix, antepartum  01/25/2013   .  Elderly multigravida with antepartum condition or complication  12/08/2012   PLAN:  Continue current care  Will plan with MFM repeat ultrasound this week  D/C planning pending MFM evaluation

## 2013-02-11 DIAGNOSIS — O169 Unspecified maternal hypertension, unspecified trimester: Secondary | ICD-10-CM

## 2013-02-11 MED ORDER — SALINE SPRAY 0.65 % NA SOLN
1.0000 | NASAL | Status: DC | PRN
  Administered 2013-02-11: 1 via NASAL
  Filled 2013-02-11: qty 44

## 2013-02-11 NOTE — Progress Notes (Signed)
TC to Dr Thad Ranger to report pt's complaint of the beginning of a sinus headache.  See orders.

## 2013-02-11 NOTE — Progress Notes (Signed)
Patient ID: Alexandra Murphy, female   DOB: 1975/12/19, 37 y.o.   MRN: [id number]  FACULTY PRACTICE ANTEPARTUM COMPREHENSIVE PROGRESS NOTE  Victor Granados is a 37 y.o. [id number] at [redacted]w[redacted]d  who is admitted for short cervix.  Estimated Date of Delivery: 05/10/13 Fetal presentation is breech.  Length of Stay:  17 Days. 01/25/2013  Subjective: Patient reports good fetal movement.  She reports no uterine contractions, no bleeding and no loss of fluid per vagina.  Vitals:  Blood pressure 120/77, pulse 86, temperature 97.9 F (36.6 C), temperature source Oral, resp. rate 18, height 5\' 7"  (1.702 m), weight 116.319 kg (256 lb 7 oz), last menstrual period 08/03/2012. Physical Examination: GEN:  WNWD, no distress HEENT:  NCAT, EOMI, conjunctiva clear CV: RRR, no murmur RESP:  CTAB ABD:  Soft, non-tender, no guarding or rebound, normal bowel sounds EXTREM:  Warm, well perfused, no edema or tenderness NEURO:  Alert, oriented, no focal deficits Cervical Exam: Not evaluated.  Membranes:intact  Fetal Monitoring:  Baseline: 135-145 bpm, Variability: Fair (1-6 bpm), Accelerations: Reactive and Decelerations: Absent  Labs:  No results found for this or any previous visit (from the past 24 hour(s)).  Imaging Studies:    No new studies   Medications:  Scheduled . amLODipine  5 mg Oral Daily  . docusate sodium  100 mg Oral BID  . enoxaparin (LOVENOX) injection  40 mg Subcutaneous Q24H  . enoxaparin   Does not apply Once  . loratadine  10 mg Oral Daily  . nicotine  14 mg Transdermal Daily  . prenatal multivitamin  1 tablet Oral Q1200  . progesterone  200 mg Vaginal QHS   I have reviewed the patient's current medications.  ASSESSMENT: Patient Active Problem List   Diagnosis Date Noted  . Chronic hypertension complicating or reason for care during pregnancy 01/29/2013  . Short cervix, antepartum 01/25/2013  . Elderly multigravida with antepartum condition or complication 12/08/2012     PLAN: Reevaluate cervix by ultrasound and digital exam at 28 weeks and possibly discharge home with modified activity. Continue routine antenatal care.  Napoleon Form, MD 02/11/2013 7:41 AM

## 2013-02-12 DIAGNOSIS — O09529 Supervision of elderly multigravida, unspecified trimester: Secondary | ICD-10-CM

## 2013-02-12 DIAGNOSIS — O321XX0 Maternal care for breech presentation, not applicable or unspecified: Secondary | ICD-10-CM

## 2013-02-12 NOTE — Progress Notes (Signed)
Patient ID: Alexandra Murphy, female   DOB: 03-Jan-1976, 37 y.o.   MRN: [id number]  FACULTY PRACTICE ANTEPARTUM COMPREHENSIVE PROGRESS NOTE  Alexandra Murphy is a 37 y.o. [id number] at [redacted]w[redacted]d  who is admitted for cervical insufficiency.  Estimated Date of Delivery: 05/10/13 Fetal presentation is footling breech as of u/s 5/28.  Length of Stay:  18 Days. 01/25/2013  Subjective: Patient reports good fetal movement.  She reports no uterine contractions, no bleeding and no loss of fluid per vagina.  Vitals:  Blood pressure 128/49, pulse 84, temperature 98.7 F (37.1 C), temperature source Oral, resp. rate 20, height 5\' 7"  (1.702 m), weight 116.319 kg (256 lb 7 oz), last menstrual period 08/03/2012.  Physical Examination: GEN:  WNWD, no distress HEENT:  NCAT, EOMI, conjunctiva clear NECK:  Supple, non-tender, no thyromegaly, trachea midline CV: RRR, no murmur RESP:  CTAB ABD:  Soft, non-tender, no guarding or rebound, normal bowel sounds EXTREM:  Warm, well perfused, no edema or tenderness NEURO:  Alert, oriented, no focal deficits Cervical Exam: Not evaluated.  Membranes:intact  Fetal Monitoring:  Baseline: 145 bpm, Variability: Fair (1-6 bpm), Accelerations: Reactive and Decelerations: Absent  Labs:  No results found for this or any previous visit (from the past 24 hour(s)).  Imaging Studies:    Last ultrasound at [facility] on 5/28 showed footling breech, normal fluid, EFW 50%.  Medications:  Scheduled . amLODipine  5 mg Oral Daily  . docusate sodium  100 mg Oral BID  . enoxaparin (LOVENOX) injection  40 mg Subcutaneous Q24H  . enoxaparin   Does not apply Once  . loratadine  10 mg Oral Daily  . nicotine  14 mg Transdermal Daily  . prenatal multivitamin  1 tablet Oral Q1200  . progesterone  200 mg Vaginal QHS   I have reviewed the patient's current medications.  ASSESSMENT: Patient Active Problem List   Diagnosis Date Noted  . Chronic hypertension complicating or reason for care during  pregnancy 01/29/2013  . Short cervix, antepartum 01/25/2013  . Elderly multigravida with antepartum condition or complication 12/08/2012    PLAN: Reevaluate cervix by digital exam at 28 weeks. Consider discharge home at 28-30 weeks if no cervical change BP well controlled - continue amlodipine. Continue routine antenatal care.  Napoleon Form, MD 02/12/2013 11:23 AM

## 2013-02-13 NOTE — Progress Notes (Signed)
Patient ID: Alexandra Murphy, female   DOB: 1976-06-13, 37 y.o.   MRN: [id number] ACULTY PRACTICE ANTEPARTUM COMPREHENSIVE PROGRESS NOTE  Alexandra Murphy is a 37 y.o. [id number] at [redacted]w[redacted]d  who is admitted for preterm cervical dilation .   Fetal presentation is breech. Length of Stay:  19  Days  Subjective: Pt with no complaints. She wanted to go home today for a short period.  Patient reports good fetal movement.  She reports no uterine contractions, no bleeding and no loss of fluid per vagina.  Pt has lost the urge to smoke and reports that she feel much better off the tobacco!  Vitals:  Blood pressure 110/51, pulse 71, temperature 98.4 F (36.9 C), temperature source Oral, resp. rate 18, height 5\' 7"  (1.702 m), weight 256 lb 7 oz (116.319 kg), last menstrual period 08/03/2012. Physical Examination: General appearance - alert, well appearing, and in no distress Abdomen - soft, nontender, nondistended, no masses or organomegaly gravid Extremities - peripheral pulses normal, no pedal edema, no clubbing or cyanosis Cervical Exam: Not evaluated. Extremities: extremities normal, atraumatic, no cyanosis or edema Membranes:intact  Fetal Monitoring:  Baseline: 150's bpm, Variability: Good {> 6 bpm) and Accelerations: Reactive  Labs:  No results found for this or any previous visit (from the past 24 hour(s)).  Imaging Studies:    sono   Medications:  Scheduled . amLODipine  5 mg Oral Daily  . docusate sodium  100 mg Oral BID  . enoxaparin (LOVENOX) injection  40 mg Subcutaneous Q24H  . enoxaparin   Does not apply Once  . loratadine  10 mg Oral Daily  . nicotine  14 mg Transdermal Daily  . prenatal multivitamin  1 tablet Oral Q1200  . progesterone  200 mg Vaginal QHS   I have reviewed the patient's current medications.  ASSESSMENT: Patient Active Problem List   Diagnosis Date Noted  . Chronic hypertension complicating or reason for care during pregnancy 01/29/2013  . Short cervix,  antepartum 01/25/2013  . Elderly multigravida with antepartum condition or complication 12/08/2012    PLAN: Recheck cervix in 2 days D/w pt potential discharge at 30 weeks  Reviewed reason and importance of inpatient care and risk to infant of out of hospital delivery pt expresses understanding  Encouraged and complimented patient on tobacco cessation!!! Continue routine antenatal care.   HARRAWAY-SMITH, Emya Picado 02/13/2013,11:21 AM

## 2013-02-14 LAB — TYPE AND SCREEN: ABO/RH(D): A POS

## 2013-02-14 NOTE — Progress Notes (Signed)
Patient ID: Alexandra Murphy, female   DOB: 01-04-1976, 37 y.o.   MRN: [id number]  FACULTY PRACTICE ANTEPARTUM COMPREHENSIVE PROGRESS NOTE  Brigette Hopfer is a 37 y.o. [id number] at [redacted]w[redacted]d  who is admitted for cervical insufficiency.  Estimated Date of Delivery: 05/10/13 Fetal presentation is breech.  Length of Stay:  20 Days. 01/25/2013  Subjective: Patient reports good fetal movement.  She reports no uterine contractions, no bleeding and no loss of fluid per vagina.  Vitals:  Blood pressure 123/54, pulse 80, temperature 98.9 F (37.2 C), temperature source Oral, resp. rate 18, height 5\' 7"  (1.702 m), weight 116.319 kg (256 lb 7 oz), last menstrual period 08/03/2012. Physical Examination: GEN:  WNWD, no distress HEENT:  NCAT, EOMI, conjunctiva clear NECK:  Supple, non-tender, no thyromegaly, trachea midline CV: RRR, no murmur RESP:  CTAB ABD:  Soft, non-tender, no guarding or rebound, normal bowel sounds EXTREM:  Warm, well perfused, no edema or tenderness NEURO:  Alert, oriented, no focal deficits GU:  Deferred  Membranes:intact  Fetal Monitoring:  Baseline: 135-145 bpm, Variability: Good {> 6 bpm), Accelerations: Reactive and Decelerations: Absent  Labs:  No results found for this or any previous visit (from the past 24 hour(s)).  Imaging Studies:    No new imaging   Medications:  Scheduled . amLODipine  5 mg Oral Daily  . docusate sodium  100 mg Oral BID  . enoxaparin (LOVENOX) injection  40 mg Subcutaneous Q24H  . enoxaparin   Does not apply Once  . loratadine  10 mg Oral Daily  . nicotine  14 mg Transdermal Daily  . prenatal multivitamin  1 tablet Oral Q1200  . progesterone  200 mg Vaginal QHS   I have reviewed the patient's current medications.  ASSESSMENT: Patient Active Problem List   Diagnosis Date Noted  . Chronic hypertension complicating or reason for care during pregnancy 01/29/2013  . Short cervix, antepartum 01/25/2013  . Elderly multigravida with antepartum  condition or complication 12/08/2012    PLAN: Recheck cervix tomorrow (28 wks) Possible discharge at 30 wks Continue routine antenatal care.  Napoleon Form, MD 02/14/2013 7:20 AM

## 2013-02-15 MED ORDER — POLYETHYLENE GLYCOL 3350 17 G PO PACK: 17.0000 g | PACK | Freq: Every day | ORAL | Status: DC | PRN

## 2013-02-15 MED ORDER — ZOLPIDEM TARTRATE 5 MG PO TABS: 5.0000 mg | ORAL_TABLET | Freq: Every evening | ORAL | Status: DC | PRN

## 2013-02-15 MED ORDER — NICOTINE 14 MG/24HR TD PT24: 1.0000 | MEDICATED_PATCH | Freq: Every day | TRANSDERMAL | Status: DC

## 2013-02-15 MED ORDER — DSS 100 MG PO CAPS: 100.0000 mg | ORAL_CAPSULE | Freq: Two times a day (BID) | ORAL | Status: DC

## 2013-02-15 MED ORDER — PROGESTERONE MICRONIZED 200 MG PO CAPS: 200.0000 mg | ORAL_CAPSULE | Freq: Every day | ORAL | Status: DC

## 2013-02-15 NOTE — Discharge Summary (Signed)
Antenatal Physician Discharge Summary  Patient ID: Alexandra Murphy MRN: [id number] DOB/AGE: 1976-06-15 37 y.o.  Admit date: 01/25/2013 Discharge date: 02/15/2013 Length of stay: 21  Days  Admission Diagnoses: IUP at [redacted]w[redacted]d, short cervix, chronic hypertension  Discharge Diagnoses: IUP at [redacted]w[redacted]d, short cervix, chronic hypertension  Prenatal Procedures: ultrasound, NST  Consults: Neonatology, Maternal Fetal Medicine   Significant Diagnostic Studies:  Results for orders placed during the hospital encounter of 01/25/13 (from the past 168 hour(s))  TYPE AND SCREEN   Collection Time    02/11/13  6:10 AM      Result Value Range   ABO/RH(D) A POS     Antibody Screen NEG     Sample Expiration 02/14/2013    TYPE AND SCREEN   Collection Time    02/14/13  6:20 AM      Result Value Range   ABO/RH(D) A POS     Antibody Screen NEG     Sample Expiration 02/17/2013      Treatments: Betamethasone x 2 doses; Prometrium, bed rest, lovenox  Hospital Course:  This is a 37 y.o. [id number] with IUP at [redacted]w[redacted]d admitted for shortened cervix. Patient had ultrasound 01/12/13 showing cervical length of 4 mm. On 01/25/13, follow-up ultrasound showed no measurable cervix with U-shaped funneling. Pt was admitted for observation. She had a repeat course of betamethasone and was continued on vaginal prometrium with dose increased to 200 mg qhs. She had no contractions, bleeding, or loss of fluid during her stay. NSTs were reactive the entire time. Her cervix was checked digitally on 01/29/13 and was 3 cm, 100% effaced with paper thin cervix. It was checked again on day of discharge and was 3 cm, 90%. Membranes were felt through external os on both checks but were not protruding through os. Pt's blood pressure remained normal on 5 mg amlodipine throughout hospital stay. She was placed on prophylactic Lovenox (40 mg sq daily) due to age, lack of mobility and history of smoking. The patient had nicotine patch during stay and has  effectively quit smoking.  On day of discharge, at [redacted] weeks gestation, patient requested to be discharged home. On admission, patient had been told possible discharge at 28 weeks. However, per MFM note and various attending notes, there was some strong suggestion that waiting until 30 weeks would be preferable given that patient had no signs/symptoms of labor leading to current cervical findings. Patient was offered and encouraged to continue hospital admission until 30 weeks. However, she opted to be discharged today given no change in cervical exam. She stated that she would be doing exactly the same thing at home - resting in bed with minimal activity. Labor precautions were discussed in depth - patient advised to return to hospital for any leaking of fluid, bleeding or cramping/contractions/pressure, as well as decreased fetal movement. Activity restrictions were discussed in detail - no lifting, bending, prolonged standing, walking, exercise, housework, straining, intercourse or anything else in vagina. Patient is to follow up at [facility] clinic on 6/6 for cervical exam. If unchanged, will be seen weekly until delivery or significant change in status. Patient expressed understanding of the above. She lives 30 minutes away but has plan for transportation to hospital if needed. Her significant other and 86 year old son are very supportive and will take care of her at home. This plan was also discussed with Dr. Penne Lash who agreed.   Discharge Exam: BP 103/51  Pulse 96  Temp(Src) 98.7 F (37.1 C) (Oral)  Resp 18  Ht 5\' 7"  (1.702 m)  Wt 116.319 kg (256 lb 7 oz)  BMI 40.15 kg/m2  LMP 08/03/2012  GEN: WNWD, no distress  HEENT: NCAT, EOMI, conjunctiva clear  NECK: Supple, non-tender, no thyromegaly, trachea midline  CV: RRR, no murmur  RESP: CTAB  ABD: Soft, non-tender, no guarding or rebound, normal bowel sounds  EXTREM: Warm, well perfused, no edema or tenderness  NEURO: Alert, oriented,  no focal deficits  Cervical Exam: Evaluated by digital exam. and found to be 3 cm/90%/ballottable and fetal presentation is unsure.  Membranes:intact  Discharge Condition: good  Disposition: 01-Home or Self Care  Discharge Orders   Future Orders Complete By Expires     Discharge patient  As directed     Comments:      To home        Medication List    TAKE these medications       acetaminophen 325 MG tablet  Commonly known as:  TYLENOL  Take 650 mg by mouth daily as needed for pain (For headache.).     albuterol 108 (90 BASE) MCG/ACT inhaler  Commonly known as:  PROVENTIL HFA;VENTOLIN HFA  Inhale 2 puffs into the lungs every 6 (six) hours as needed for wheezing or shortness of breath.     amLODipine 5 MG tablet  Commonly known as:  NORVASC  Take 5 mg by mouth daily.     DSS 100 MG Caps  Take 100 mg by mouth 2 (two) times daily.     nicotine 14 mg/24hr patch  Commonly known as:  NICODERM CQ - dosed in mg/24 hours  Place 1 patch onto the skin daily.     polyethylene glycol packet  Commonly known as:  MIRALAX / GLYCOLAX  Take 17 g by mouth daily as needed.     prenatal multivitamin Tabs  Take 1 tablet by mouth daily at 12 noon.     progesterone 200 MG capsule  Commonly known as:  PROMETRIUM  Place 1 capsule (200 mg total) vaginally at bedtime.     zolpidem 5 MG tablet  Commonly known as:  AMBIEN  Take 1 tablet (5 mg total) by mouth at bedtime as needed for sleep.           Follow-up Information   Follow up with [facility]. Call on 02/19/2013. (Call for appointment on Friday AM to check cervix.)    Contact information:   [address] [place] [address] [phone]      Signed: Napoleon Form M.D. 02/15/2013, 10:09 PM

## 2013-02-15 NOTE — Progress Notes (Signed)
Ur chart review completed.  

## 2013-02-15 NOTE — Progress Notes (Signed)
Discharge instructions reviewed with patient. Verbalized an understanding of instructions.  

## 2013-02-19 ENCOUNTER — Ambulatory Visit: Payer: Medicaid Other | Admitting: Obstetrics & Gynecology

## 2013-02-19 ENCOUNTER — Inpatient Hospital Stay (HOSPITAL_COMMUNITY)
Admission: AD | Admit: 2013-02-19 | Discharge: 2013-02-19 | Disposition: A | Discharge status: Home / Self Care | Payer: Medicaid Other | Source: Ambulatory Visit | Attending: Family Medicine | Admitting: Family Medicine

## 2013-02-19 DIAGNOSIS — O26879 Cervical shortening, unspecified trimester: Secondary | ICD-10-CM | POA: Insufficient documentation

## 2013-02-19 DIAGNOSIS — O26873 Cervical shortening, third trimester: Secondary | ICD-10-CM

## 2013-02-19 NOTE — MAU Provider Note (Signed)
Chart reviewed and agree with management and plan.  

## 2013-02-19 NOTE — MAU Note (Signed)
Pt presents for preterm labor/cervix check due to missing appointment this am at 0845. Pt states she is already dialated to 3 cm and that she was instructed to come to MAU for her cervical exam.

## 2013-02-19 NOTE — MAU Provider Note (Signed)
Ms. Alexandra Murphy is a 37 y.o. [id number] at [redacted]w[redacted]d who presents to MAU today because she missed her appointment in clinic this morning and needs to have her cervix checked. Patient denies pain, vaginal bleeding, discharge, or contractions. Reports good fetal movement.   BP 123/65  Pulse 81  Temp(Src) 98.5 F (36.9 C) (Oral)  Resp 18  Ht 5\' 7"  (1.702 m)  Wt 256 lb 3.2 oz (116.212 kg)  BMI 40.12 kg/m2  LMP 08/03/2012 GENERAL: Well-developed, well-nourished female in no acute distress.  HEENT: Normocephalic, atraumatic.   LUNGS: Effort normal HEART: Regular rate  SKIN: Warm, dry and without erythema PSYCH: Normal mood and affect Dilation: 3 Effacement (%): 90 Station: Ballotable Exam by:: Naaman Plummer, PA   Fetal monitoring: Baseline: 130 bpm, moderate variability, + accelerations, no decelerations Contractions: none  MDM Discussed patient with Dr. Erin Fulling as she was scheduled to see her this AM in clinic. She states that patient may be discharged from MAU if cervix is unchanged from D/C. According to D/C note from Antenatal patient was 3/90/ ballotable at time of discharge.   A: Short cervix, antepartum  P: Discharge home  Patient encouraged to call for appointment to follow-up in [facility] clinic next week Patient may return to MAU as needed  Freddi Starr, PA-C 02/19/2013 11:46 AM

## 2013-02-19 NOTE — MAU Note (Signed)
Pt notified of potential delay for SVE after her missed appointment.  Pt and s/o verbalize understanding.

## 2013-02-22 ENCOUNTER — Encounter: Payer: Medicaid Other | Admitting: Obstetrics & Gynecology

## 2013-02-22 ENCOUNTER — Ambulatory Visit (INDEPENDENT_AMBULATORY_CARE_PROVIDER_SITE_OTHER): Payer: Medicaid Other | Admitting: Obstetrics & Gynecology

## 2013-02-22 VITALS — BP 129/71 | HR 103 | Temp 97.9°F | Wt 257.6 lb

## 2013-02-22 DIAGNOSIS — O0993 Supervision of high risk pregnancy, unspecified, third trimester: Secondary | ICD-10-CM

## 2013-02-22 DIAGNOSIS — O26879 Cervical shortening, unspecified trimester: Secondary | ICD-10-CM | POA: Insufficient documentation

## 2013-02-22 DIAGNOSIS — O10019 Pre-existing essential hypertension complicating pregnancy, unspecified trimester: Secondary | ICD-10-CM

## 2013-02-22 DIAGNOSIS — O0990 Supervision of high risk pregnancy, unspecified, unspecified trimester: Secondary | ICD-10-CM | POA: Insufficient documentation

## 2013-02-22 NOTE — Patient Instructions (Signed)

## 2013-02-23 ENCOUNTER — Encounter (HOSPITAL_COMMUNITY): Payer: Self-pay | Admitting: *Deleted

## 2013-02-23 ENCOUNTER — Other Ambulatory Visit: Payer: Self-pay | Admitting: Obstetrics & Gynecology

## 2013-02-23 ENCOUNTER — Encounter (HOSPITAL_COMMUNITY): Payer: Self-pay

## 2013-02-23 ENCOUNTER — Inpatient Hospital Stay (HOSPITAL_COMMUNITY)
Admission: AD | Admit: 2013-02-23 | Discharge: 2013-02-25 | DRG: 765 | Disposition: A | Discharge status: Home / Self Care | Payer: Medicaid Other | Source: Ambulatory Visit | Attending: Obstetrics & Gynecology | Admitting: Obstetrics & Gynecology

## 2013-02-23 ENCOUNTER — Ambulatory Visit (HOSPITAL_COMMUNITY)
Admission: RE | Admit: 2013-02-23 | Discharge: 2013-02-23 | Disposition: A | Discharge status: Home / Self Care | Payer: Medicaid Other | Source: Ambulatory Visit | Attending: Obstetrics & Gynecology | Admitting: Obstetrics & Gynecology

## 2013-02-23 ENCOUNTER — Inpatient Hospital Stay (HOSPITAL_COMMUNITY): Payer: Medicaid Other

## 2013-02-23 ENCOUNTER — Encounter (HOSPITAL_COMMUNITY)
Admission: AD | Disposition: A | Discharge status: Home / Self Care | Payer: Self-pay | Source: Ambulatory Visit | Attending: Obstetrics & Gynecology

## 2013-02-23 DIAGNOSIS — O321XX0 Maternal care for breech presentation, not applicable or unspecified: Secondary | ICD-10-CM | POA: Diagnosis present

## 2013-02-23 DIAGNOSIS — O26879 Cervical shortening, unspecified trimester: Secondary | ICD-10-CM | POA: Diagnosis present

## 2013-02-23 DIAGNOSIS — O3440 Maternal care for other abnormalities of cervix, unspecified trimester: Secondary | ICD-10-CM

## 2013-02-23 DIAGNOSIS — Z302 Encounter for sterilization: Secondary | ICD-10-CM

## 2013-02-23 DIAGNOSIS — O368190 Decreased fetal movements, unspecified trimester, not applicable or unspecified: Secondary | ICD-10-CM | POA: Diagnosis present

## 2013-02-23 DIAGNOSIS — O411090 Infection of amniotic sac and membranes, unspecified, unspecified trimester, not applicable or unspecified: Secondary | ICD-10-CM

## 2013-02-23 DIAGNOSIS — Z98891 History of uterine scar from previous surgery: Secondary | ICD-10-CM

## 2013-02-23 DIAGNOSIS — Z9851 Tubal ligation status: Secondary | ICD-10-CM

## 2013-02-23 LAB — DIFFERENTIAL
Basophils Absolute: 0 10*3/uL (ref 0.0–0.1)
Eosinophils Absolute: 0.2 10*3/uL (ref 0.0–0.7)
Eosinophils Relative: 1 % (ref 0–5)
Lymphocytes Relative: 19 % (ref 12–46)
Lymphs Abs: 2.4 10*3/uL (ref 0.7–4.0)
Monocytes Absolute: 1.2 10*3/uL — ABNORMAL HIGH (ref 0.1–1.0)

## 2013-02-23 LAB — TYPE AND SCREEN: ABO/RH(D): A POS

## 2013-02-23 LAB — CBC
Hemoglobin: 10.4 g/dL — ABNORMAL LOW (ref 12.0–15.0)
Platelets: 234 10*3/uL (ref 150–400)
RBC: 3.59 MIL/uL — ABNORMAL LOW (ref 3.87–5.11)
WBC: 12.3 10*3/uL — ABNORMAL HIGH (ref 4.0–10.5)

## 2013-02-23 SURGERY — Surgical Case
Anesthesia: Spinal | Site: Abdomen | Wound class: Clean Contaminated

## 2013-02-23 MED ORDER — LANOLIN HYDROUS EX OINT: 1.0000 "application " | TOPICAL_OINTMENT | CUTANEOUS | Status: DC | PRN

## 2013-02-23 MED ORDER — LACTATED RINGERS IV SOLN
INTRAVENOUS | Status: DC | PRN
  Administered 2013-02-23 (×4): via INTRAVENOUS

## 2013-02-23 MED ORDER — SODIUM CHLORIDE 0.9 % IJ SOLN: 3.0000 mL | INTRAMUSCULAR | Status: DC | PRN

## 2013-02-23 MED ORDER — DIPHENHYDRAMINE HCL 25 MG PO CAPS
25.0000 mg | ORAL_CAPSULE | ORAL | Status: DC | PRN
  Administered 2013-02-24 – 2013-02-25 (×4): 25 mg via ORAL
  Filled 2013-02-23 (×5): qty 1

## 2013-02-23 MED ORDER — MAGNESIUM HYDROXIDE 400 MG/5ML PO SUSP: 30.0000 mL | ORAL | Status: DC | PRN

## 2013-02-23 MED ORDER — DIPHENHYDRAMINE HCL 25 MG PO CAPS: 25.0000 mg | ORAL_CAPSULE | Freq: Four times a day (QID) | ORAL | Status: DC | PRN

## 2013-02-23 MED ORDER — SCOPOLAMINE 1 MG/3DAYS TD PT72
1.0000 | MEDICATED_PATCH | Freq: Once | TRANSDERMAL | Status: DC
  Administered 2013-02-23: 1.5 mg via TRANSDERMAL

## 2013-02-23 MED ORDER — PRENATAL MULTIVITAMIN CH
1.0000 | ORAL_TABLET | Freq: Every day | ORAL | Status: DC
  Administered 2013-02-24: 1 via ORAL
  Filled 2013-02-23: qty 1

## 2013-02-23 MED ORDER — HYDROMORPHONE HCL PF 1 MG/ML IJ SOLN: 0.2500 mg | INTRAMUSCULAR | Status: DC | PRN

## 2013-02-23 MED ORDER — DIPHENHYDRAMINE HCL 50 MG/ML IJ SOLN: 12.5000 mg | INTRAMUSCULAR | Status: DC | PRN

## 2013-02-23 MED ORDER — CEFAZOLIN SODIUM-DEXTROSE 2-3 GM-% IV SOLR: 2.0000 g | Freq: Once | INTRAVENOUS | Status: DC

## 2013-02-23 MED ORDER — LACTATED RINGERS IV SOLN
INTRAVENOUS | Status: DC | PRN
  Administered 2013-02-23: 17:00:00 via INTRAVENOUS

## 2013-02-23 MED ORDER — ALBUTEROL SULFATE HFA 108 (90 BASE) MCG/ACT IN AERS
INHALATION_SPRAY | RESPIRATORY_TRACT | Status: AC
  Filled 2013-02-23: qty 6.7

## 2013-02-23 MED ORDER — ONDANSETRON HCL 4 MG/2ML IJ SOLN
4.0000 mg | INTRAMUSCULAR | Status: DC | PRN
  Administered 2013-02-23: 4 mg via INTRAVENOUS

## 2013-02-23 MED ORDER — SENNOSIDES-DOCUSATE SODIUM 8.6-50 MG PO TABS
2.0000 | ORAL_TABLET | Freq: Every day | ORAL | Status: DC
  Administered 2013-02-24: 2 via ORAL

## 2013-02-23 MED ORDER — MEPERIDINE HCL 25 MG/ML IJ SOLN: 6.2500 mg | INTRAMUSCULAR | Status: DC | PRN

## 2013-02-23 MED ORDER — DIPHENHYDRAMINE HCL 50 MG/ML IJ SOLN
INTRAMUSCULAR | Status: AC
  Administered 2013-02-23: 12.5 mg via INTRAVENOUS
  Filled 2013-02-23: qty 1

## 2013-02-23 MED ORDER — METOCLOPRAMIDE HCL 5 MG/ML IJ SOLN: 10.0000 mg | Freq: Three times a day (TID) | INTRAMUSCULAR | Status: DC | PRN

## 2013-02-23 MED ORDER — OXYTOCIN 10 UNIT/ML IJ SOLN
40.0000 [IU] | INTRAVENOUS | Status: DC | PRN
  Administered 2013-02-23: 40 [IU] via INTRAVENOUS

## 2013-02-23 MED ORDER — OXYTOCIN 10 UNIT/ML IJ SOLN
INTRAMUSCULAR | Status: AC
  Filled 2013-02-23: qty 4

## 2013-02-23 MED ORDER — KETOROLAC TROMETHAMINE 30 MG/ML IJ SOLN: 30.0000 mg | Freq: Four times a day (QID) | INTRAMUSCULAR | Status: AC | PRN

## 2013-02-23 MED ORDER — BUPIVACAINE HCL (PF) 0.5 % IJ SOLN
INTRAMUSCULAR | Status: DC | PRN
  Administered 2013-02-23: 30 mL

## 2013-02-23 MED ORDER — KETOROLAC TROMETHAMINE 30 MG/ML IJ SOLN
15.0000 mg | Freq: Once | INTRAMUSCULAR | Status: AC | PRN
  Administered 2013-02-23: 30 mg via INTRAVENOUS

## 2013-02-23 MED ORDER — IBUPROFEN 600 MG PO TABS
600.0000 mg | ORAL_TABLET | Freq: Four times a day (QID) | ORAL | Status: DC
  Administered 2013-02-23 – 2013-02-25 (×6): 600 mg via ORAL
  Filled 2013-02-23 (×6): qty 1

## 2013-02-23 MED ORDER — FENTANYL CITRATE 0.05 MG/ML IJ SOLN
INTRAMUSCULAR | Status: DC | PRN
  Administered 2013-02-23: 50 ug via INTRAVENOUS
  Administered 2013-02-23: 37.5 ug via INTRAVENOUS

## 2013-02-23 MED ORDER — NALOXONE HCL 0.4 MG/ML IJ SOLN: 0.4000 mg | INTRAMUSCULAR | Status: DC | PRN

## 2013-02-23 MED ORDER — DIPHENHYDRAMINE HCL 50 MG/ML IJ SOLN: 25.0000 mg | INTRAMUSCULAR | Status: DC | PRN

## 2013-02-23 MED ORDER — MENTHOL 3 MG MT LOZG: 1.0000 | LOZENGE | OROMUCOSAL | Status: DC | PRN

## 2013-02-23 MED ORDER — NALOXONE HCL 1 MG/ML IJ SOLN
1.0000 ug/kg/h | INTRAVENOUS | Status: DC | PRN
  Filled 2013-02-23: qty 2

## 2013-02-23 MED ORDER — WITCH HAZEL-GLYCERIN EX PADS: 1.0000 "application " | MEDICATED_PAD | CUTANEOUS | Status: DC | PRN

## 2013-02-23 MED ORDER — FAMOTIDINE IN NACL 20-0.9 MG/50ML-% IV SOLN
20.0000 mg | Freq: Once | INTRAVENOUS | Status: AC
  Administered 2013-02-23: 20 mg via INTRAVENOUS
  Filled 2013-02-23: qty 50

## 2013-02-23 MED ORDER — PROMETHAZINE HCL 25 MG/ML IJ SOLN: 6.2500 mg | INTRAMUSCULAR | Status: DC | PRN

## 2013-02-23 MED ORDER — CITRIC ACID-SODIUM CITRATE 334-500 MG/5ML PO SOLN
30.0000 mL | Freq: Once | ORAL | Status: AC
  Administered 2013-02-23: 30 mL via ORAL
  Filled 2013-02-23: qty 15

## 2013-02-23 MED ORDER — DIBUCAINE 1 % RE OINT: 1.0000 "application " | TOPICAL_OINTMENT | RECTAL | Status: DC | PRN

## 2013-02-23 MED ORDER — BUPIVACAINE IN DEXTROSE 0.75-8.25 % IT SOLN
INTRATHECAL | Status: DC | PRN
  Administered 2013-02-23: 1.4 mL via INTRATHECAL

## 2013-02-23 MED ORDER — MORPHINE SULFATE (PF) 0.5 MG/ML IJ SOLN
INTRAMUSCULAR | Status: DC | PRN
  Administered 2013-02-23: .2 mg via INTRATHECAL

## 2013-02-23 MED ORDER — SCOPOLAMINE 1 MG/3DAYS TD PT72
MEDICATED_PATCH | TRANSDERMAL | Status: AC
  Filled 2013-02-23: qty 1

## 2013-02-23 MED ORDER — NALBUPHINE SYRINGE 5 MG/0.5 ML
5.0000 mg | INJECTION | INTRAMUSCULAR | Status: DC | PRN
  Filled 2013-02-23: qty 1

## 2013-02-23 MED ORDER — KETOROLAC TROMETHAMINE 30 MG/ML IJ SOLN
INTRAMUSCULAR | Status: AC
  Filled 2013-02-23: qty 1

## 2013-02-23 MED ORDER — PHENYLEPHRINE HCL 10 MG/ML IJ SOLN
INTRAMUSCULAR | Status: DC | PRN
  Administered 2013-02-23 (×4): 40 ug via INTRAVENOUS

## 2013-02-23 MED ORDER — EPHEDRINE 5 MG/ML INJ
INTRAVENOUS | Status: AC
  Filled 2013-02-23: qty 10

## 2013-02-23 MED ORDER — EPHEDRINE SULFATE 50 MG/ML IJ SOLN
INTRAMUSCULAR | Status: DC | PRN
  Administered 2013-02-23: 5 mg via INTRAVENOUS
  Administered 2013-02-23 (×2): 10 mg via INTRAVENOUS
  Administered 2013-02-23 (×2): 5 mg via INTRAVENOUS

## 2013-02-23 MED ORDER — OXYCODONE-ACETAMINOPHEN 5-325 MG PO TABS
1.0000 | ORAL_TABLET | ORAL | Status: DC | PRN
  Administered 2013-02-24 – 2013-02-25 (×5): 2 via ORAL
  Filled 2013-02-23 (×5): qty 2

## 2013-02-23 MED ORDER — ONDANSETRON HCL 4 MG PO TABS: 4.0000 mg | ORAL_TABLET | ORAL | Status: DC | PRN

## 2013-02-23 MED ORDER — FENTANYL CITRATE 0.05 MG/ML IJ SOLN
INTRAMUSCULAR | Status: DC | PRN
  Administered 2013-02-23: 12.5 ug via INTRATHECAL

## 2013-02-23 MED ORDER — OXYTOCIN 40 UNITS IN LACTATED RINGERS INFUSION - SIMPLE MED: 125.0000 mL/h | INTRAVENOUS | Status: AC

## 2013-02-23 MED ORDER — SODIUM CHLORIDE 0.9 % IR SOLN
Status: DC | PRN
  Administered 2013-02-23: 1000 mL

## 2013-02-23 MED ORDER — ALBUTEROL SULFATE HFA 108 (90 BASE) MCG/ACT IN AERS
2.0000 | INHALATION_SPRAY | Freq: Four times a day (QID) | RESPIRATORY_TRACT | Status: DC | PRN
  Filled 2013-02-23: qty 6.7

## 2013-02-23 MED ORDER — CEFAZOLIN SODIUM-DEXTROSE 2-3 GM-% IV SOLR
INTRAVENOUS | Status: DC | PRN
  Administered 2013-02-23: 2 g via INTRAVENOUS

## 2013-02-23 MED ORDER — ZOLPIDEM TARTRATE 5 MG PO TABS
5.0000 mg | ORAL_TABLET | Freq: Every evening | ORAL | Status: DC | PRN
  Administered 2013-02-24 – 2013-02-25 (×2): 5 mg via ORAL
  Filled 2013-02-23 (×2): qty 1

## 2013-02-23 MED ORDER — AMLODIPINE BESYLATE 5 MG PO TABS
5.0000 mg | ORAL_TABLET | Freq: Every day | ORAL | Status: DC
  Administered 2013-02-24 – 2013-02-25 (×2): 5 mg via ORAL
  Filled 2013-02-23 (×2): qty 1

## 2013-02-23 MED ORDER — ONDANSETRON HCL 4 MG/2ML IJ SOLN
INTRAMUSCULAR | Status: AC
  Filled 2013-02-23: qty 2

## 2013-02-23 MED ORDER — PHENYLEPHRINE 40 MCG/ML (10ML) SYRINGE FOR IV PUSH (FOR BLOOD PRESSURE SUPPORT)
PREFILLED_SYRINGE | INTRAVENOUS | Status: AC
  Filled 2013-02-23: qty 5

## 2013-02-23 MED ORDER — PNEUMOCOCCAL VAC POLYVALENT 25 MCG/0.5ML IJ INJ
0.5000 mL | INJECTION | INTRAMUSCULAR | Status: AC
  Administered 2013-02-24: 0.5 mL via INTRAMUSCULAR
  Filled 2013-02-23: qty 0.5

## 2013-02-23 MED ORDER — ALBUTEROL SULFATE HFA 108 (90 BASE) MCG/ACT IN AERS
INHALATION_SPRAY | RESPIRATORY_TRACT | Status: DC | PRN
  Administered 2013-02-23 (×2): 1 via RESPIRATORY_TRACT

## 2013-02-23 MED ORDER — ONDANSETRON HCL 4 MG/2ML IJ SOLN
INTRAMUSCULAR | Status: DC | PRN
  Administered 2013-02-23: 4 mg via INTRAVENOUS

## 2013-02-23 MED ORDER — SIMETHICONE 80 MG PO CHEW: 80.0000 mg | CHEWABLE_TABLET | ORAL | Status: DC | PRN

## 2013-02-23 MED ORDER — KETOROLAC TROMETHAMINE 60 MG/2ML IM SOLN
60.0000 mg | Freq: Once | INTRAMUSCULAR | Status: AC | PRN
  Filled 2013-02-23: qty 2

## 2013-02-23 MED ORDER — ONDANSETRON HCL 4 MG/2ML IJ SOLN
4.0000 mg | Freq: Three times a day (TID) | INTRAMUSCULAR | Status: DC | PRN
  Filled 2013-02-23: qty 2

## 2013-02-23 SURGICAL SUPPLY — 34 items
BLADE SURG CLIPPER 3M 9600 (MISCELLANEOUS) ×3 IMPLANT
CLAMP CORD UMBIL (MISCELLANEOUS) IMPLANT
CLIP FILSHIE TUBAL LIGA STRL (Clip) ×3 IMPLANT
CLOTH BEACON ORANGE TIMEOUT ST (SAFETY) ×3 IMPLANT
DRAPE LG THREE QUARTER DISP (DRAPES) ×3 IMPLANT
DRSG OPSITE POSTOP 4X10 (GAUZE/BANDAGES/DRESSINGS) ×3 IMPLANT
DURAPREP 26ML APPLICATOR (WOUND CARE) ×3 IMPLANT
ELECT REM PT RETURN 9FT ADLT (ELECTROSURGICAL) ×3
ELECTRODE REM PT RTRN 9FT ADLT (ELECTROSURGICAL) ×2 IMPLANT
EXTRACTOR VACUUM M CUP 4 TUBE (SUCTIONS) IMPLANT
GLOVE BIO SURGEON STRL SZ7 (GLOVE) ×3 IMPLANT
GLOVE BIOGEL PI IND STRL 7.0 (GLOVE) ×2 IMPLANT
GLOVE BIOGEL PI INDICATOR 7.0 (GLOVE) ×1
GOWN STRL REIN XL XLG (GOWN DISPOSABLE) ×6 IMPLANT
KIT ABG SYR 3ML LUER SLIP (SYRINGE) IMPLANT
NEEDLE HYPO 22GX1.5 SAFETY (NEEDLE) ×3 IMPLANT
NEEDLE HYPO 25X5/8 SAFETYGLIDE (NEEDLE) ×3 IMPLANT
NS IRRIG 1000ML POUR BTL (IV SOLUTION) ×3 IMPLANT
PACK C SECTION WH (CUSTOM PROCEDURE TRAY) ×3 IMPLANT
PAD ABD 7.5X8 STRL (GAUZE/BANDAGES/DRESSINGS) ×3 IMPLANT
PAD OB MATERNITY 4.3X12.25 (PERSONAL CARE ITEMS) ×3 IMPLANT
RTRCTR C-SECT PINK 25CM LRG (MISCELLANEOUS) IMPLANT
SLEEVE SCD COMPRESS KNEE MED (MISCELLANEOUS) ×3 IMPLANT
STAPLER VISISTAT 35W (STAPLE) IMPLANT
SUT PDS AB 0 CT1 27 (SUTURE) IMPLANT
SUT PDS AB 0 CTX 36 PDP370T (SUTURE) IMPLANT
SUT VIC AB 0 CT1 36 (SUTURE) ×6 IMPLANT
SUT VIC AB 0 CTX 36 (SUTURE) ×2
SUT VIC AB 0 CTX36XBRD ANBCTRL (SUTURE) ×4 IMPLANT
SUT VIC AB 4-0 KS 27 (SUTURE) ×3 IMPLANT
SYR 30ML LL (SYRINGE) ×3 IMPLANT
TOWEL OR 17X24 6PK STRL BLUE (TOWEL DISPOSABLE) ×9 IMPLANT
TRAY FOLEY CATH 14FR (SET/KITS/TRAYS/PACK) ×3 IMPLANT
WATER STERILE IRR 1000ML POUR (IV SOLUTION) ×3 IMPLANT

## 2013-02-23 NOTE — H&P (View-Only) (Signed)
Bettejane Leavens  was seen today for an ultrasound appointment.  See full report in [facility].  Comments: Ms. Friscia was seen today for a cervical length ultrasound.  She reports some decreased fetal movement overnight, but denies uterine contractions, vaginal bleeding or leakage of fluid.  Ms. Zurita was recently discharged due to shortened cervix and previously completed a course of betamethasone.  Throughout the course of the examination, fetal tachycardia was noted (180's-190's).  A Biophysical profile of 2/8 was noted with absent fetal movement, tone and breathing.  Impression: Single IUP at 29 1/7 weeks Digital exam: 3 cm/ 50%.  Fetal parts palpated (incomplete breech presentation) Elevated UA Dopplers noted without AEDF or REDF. BPP 2/8 Fetal tachycardia  Findings most likely consistent with intra-amniotic infection, particularly in light of the shortened cervix and exposed membranes.  Recommendations: Patient was transferred to MAU for further evaluation and fetal monitoring. Although not yet febrile, I am suspicious of an intraamniotic infection - would have low threshold work up and for IV antibiotics Would move toward delivery (C-section) due to breech presentation and non-reassuring fetal status.  Findings and recommendations discussed with Dr. Elna Breslow, MD

## 2013-02-23 NOTE — Anesthesia Preprocedure Evaluation (Signed)
Anesthesia Evaluation  Patient identified by MRN, date of birth, ID band Patient awake    Reviewed: Allergy & Precautions, H&P , Patient's Chart, lab work & pertinent test results  Airway Mallampati: II TM Distance: >3 FB Neck ROM: full    Dental no notable dental hx.    Pulmonary    Pulmonary exam normal       Cardiovascular negative cardio ROS      Neuro/Psych negative neurological ROS  negative psych ROS   GI/Hepatic negative GI ROS, Neg liver ROS,   Endo/Other  Morbid obesity  Renal/GU negative Renal ROS  negative genitourinary   Musculoskeletal negative musculoskeletal ROS (+)   Abdominal (+) + obese,   Peds  Hematology negative hematology ROS (+)   Anesthesia Other Findings   Reproductive/Obstetrics (+) Pregnancy                           Anesthesia Physical Anesthesia Plan  ASA: III and emergent  Anesthesia Plan: Spinal   Post-op Pain Management:    Induction:   Airway Management Planned:   Additional Equipment:   Intra-op Plan:   Post-operative Plan:   Informed Consent: I have reviewed the patients History and Physical, chart, labs and discussed the procedure including the risks, benefits and alternatives for the proposed anesthesia with the patient or authorized representative who has indicated his/her understanding and acceptance.     Plan Discussed with: CRNA and Surgeon  Anesthesia Plan Comments:         Anesthesia Quick Evaluation

## 2013-02-23 NOTE — MAU Note (Signed)
Pt sent over from MFM [facility] 2/8

## 2013-02-23 NOTE — Progress Notes (Signed)
Alexandra Murphy  was seen today for an ultrasound appointment.  See full report in [facility].  Comments: Alexandra Murphy was seen today for a cervical length ultrasound.  She reports some decreased fetal movement overnight, but denies uterine contractions, vaginal bleeding or leakage of fluid.  Alexandra Murphy was recently discharged due to shortened cervix and previously completed a course of betamethasone.  Throughout the course of the examination, fetal tachycardia was noted (180's-190's).  A Biophysical profile of 2/8 was noted with absent fetal movement, tone and breathing.  Impression: Single IUP at 29 1/7 weeks Digital exam: 3 cm/ 50%.  Fetal parts palpated (incomplete breech presentation) Elevated UA Dopplers noted without AEDF or REDF. BPP 2/8 Fetal tachycardia  Findings most likely consistent with intra-amniotic infection, particularly in light of the shortened cervix and exposed membranes.  Recommendations: Patient was transferred to MAU for further evaluation and fetal monitoring. Although not yet febrile, I am suspicious of an intraamniotic infection - would have low threshold work up and for IV antibiotics Would move toward delivery (C-section) due to breech presentation and non-reassuring fetal status.  Findings and recommendations discussed with Dr. [name]  [name], MD 

## 2013-02-23 NOTE — Anesthesia Postprocedure Evaluation (Signed)
Anesthesia Post Note  Patient: Alexandra Murphy  Procedure(s) Performed: Procedure(s) (LRB): CESAREAN SECTION WITH BILATERAL TUBAL LIGATION (N/A)  Anesthesia type: Spinal  Patient location: PACU  Post pain: Pain level controlled  Post assessment: Post-op Vital signs reviewed  Last Vitals:  Filed Vitals:   02/23/13 1730  BP: 117/55  Pulse: 67  Temp:   Resp: 17    Post vital signs: Reviewed  Level of consciousness: awake  Complications: No apparent anesthesia complications

## 2013-02-23 NOTE — MAU Note (Signed)
Patient to MAU from MFM for urgent cesarean section due to BPP 2/8 and fetal tachycardia.

## 2013-02-23 NOTE — Transfer of Care (Signed)
Immediate Anesthesia Transfer of Care Note  Patient: Alexandra Murphy  Procedure(s) Performed: Procedure(s) with comments: CESAREAN SECTION WITH BILATERAL TUBAL LIGATION (N/A) - Primary Cesarean Section Delivery Baby Boy @ 1631, Apgars   Patient Location: PACU  Anesthesia Type:Spinal  Level of Consciousness: awake, alert , oriented and patient cooperative  Airway & Oxygen Therapy: Patient Spontanous Breathing  Post-op Assessment: Report given to PACU RN and Post -op Vital signs reviewed and stable  Post vital signs: Reviewed and stable  Complications: No apparent anesthesia complications

## 2013-02-23 NOTE — Interval H&P Note (Signed)
Preoperative Interval Note 02/23/2013 3:46 PM  Alexandra Murphy  has presented today for cesarean section with the diagnosis of chorioamnionitis, BPP 2/10, advanced cervical dilation, breech presentation at [redacted]w[redacted]d.   Dr. Claudean Severance (MFM) recommends delivery.  Patient also reports undesired fertility, wants tubal sterilization no matter what the outcome is with this infant, "I cannot do this (get pregnant) again".  The risks of cesarean section were discussed with the patient including but were not limited to: bleeding which may require transfusion or reoperation; infection which may require antibiotics; injury to bowel, bladder, ureters or other surrounding organs; injury to the fetus; need for additional procedures including hysterectomy in the event of a life-threatening hemorrhage; placental abnormalities wth subsequent pregnancies, incisional problems, thromboembolic phenomenon and other postoperative/anesthesia complications.  Patient also desires permanent sterilization.  Other reversible forms of contraception were discussed with patient; she declines all other modalities. Risks of procedure discussed with patient including but not limited to: risk of regret, permanence of method, bleeding, infection, injury to surrounding organs and need for additional procedures.  Failure risk of 1-2% with increased risk of ectopic gestation if pregnancy occurs was also discussed with patient.  The patient concurred with the proposed plan, giving informed written consent for the procedures.  Patient has been NPO since 1200 she will remain NPO for procedure. Anesthesia and OR aware.  NICU informed.  Preoperative prophylactic antibiotics and SCDs ordered on call to the OR.  To OR when ready.  Jaynie Collins, MD, FACOG Attending Obstetrician & Gynecologist Faculty Practice, [facility]

## 2013-02-23 NOTE — Op Note (Signed)
[facility] Rill PROCEDURE DATE: 02/23/2013  PREOPERATIVE DIAGNOSES: Intrauterine pregnancy at  [redacted]w[redacted]d weeks gestation; BPP 2/10;  fetal tachycardia and abdominal tenderness concerning for chorioamnionitis; breech presentation; advanced cervical dilation; undesired fertility.  POSTOPERATIVE DIAGNOSES: The same  PROCEDURE: Primary Low Transverse Cesarean Section, Bilateral Tubal Sterilization using Filshie clips  SURGEON:  Dr. Jaynie Collins  ANESTHESIOLOGIST: Dr. Norina Buzzard  INDICATIONS: Alexandra Murphy is a 37 y.o. [phone] at [redacted]w[redacted]d here for urgent cesarean section and bilateral tubal sterilization secondary to the indications listed under preoperative diagnosis; please see preoperative note for further details.  The risks of surgery were discussed with the patient including but were not limited to: bleeding which may require transfusion or reoperation; infection which may require antibiotics; injury to bowel, bladder, ureters or other surrounding organs; injury to the fetus; need for additional procedures including hysterectomy in the event of a life-threatening hemorrhage; placental abnormalities wth subsequent pregnancies, incisional problems, thromboembolic phenomenon and other postoperative/anesthesia complications.  Patient also desires permanent sterilization no matter what the fetal prognosis or outcome becomes.  Other reversible forms of contraception were discussed with patient; she declines all other modalities. Risks of procedure discussed with patient including but not limited to: risk of regret, permanence of method, bleeding, infection, injury to surrounding organs and need for additional procedures.  Failure risk of 1-2% with increased risk of ectopic gestation if pregnancy occurs was also discussed with patient.  The patient concurred with the proposed plan, giving informed written consent for the procedures.    FINDINGS:  Viable female infant in double footling breech presentation.   Apgars 4/6/8.  Clear amniotic fluid.  Intact placenta, three vessel cord.  Normal uterus, fallopian tubes and ovaries bilaterally.  ANESTHESIA: Spinal INTRAVENOUS FLUIDS: 3500 ml ESTIMATED BLOOD LOSS: 800 ml URINE OUTPUT:  200 ml SPECIMENS: Placenta sent to pathology COMPLICATIONS: None immediate  PROCEDURE IN DETAIL:  The patient preoperatively received intravenous antibiotics and had sequential compression devices applied to her lower extremities.   She was then taken to the operating room where spinal anesthesia was administered and was found to be adequate. She was then placed in a dorsal supine position with a leftward tilt, and prepped and draped in a sterile manner.  A foley catheter was placed into her bladder and attached to constant gravity.  After an adequate timeout was performed, a Pfannenstiel skin incision was made with scalpel and carried through to the underlying layer of fascia. The fascia was incised in the midline, and this incision was extended bilaterally using the Mayo scissors.  Kocher clamps were applied to the superior aspect of the fascial incision and the underlying rectus muscles were dissected off bluntly. A similar process was carried out on the inferior aspect of the fascial incision. The rectus muscles were separated in the midline bluntly and the peritoneum was entered bluntly. Attention was turned to the lower uterine segment where a low transverse hysterotomy was made with a scalpel and extended bilaterally bluntly.  The infant was successfully delivered, the cord was clamped and cut and the infant was handed over to awaiting neonatology team. Uterine massage was then administered, and the placenta delivered intact with a three-vessel cord. The uterus was then cleared of clot and debris.  The hysterotomy was closed with 0 Vicryl in a running locked fashion, and an imbricating layer was also placed with 0 Vicryl.  Attention was then turned to the fallopian tubes.  A  Filshie clip was placed on both tubes, about 3 cm from the cornua,  with care given to incorporate the underlying mesosalpinx on both sides, allowing for bilateral tubal sterilization. The pelvis was cleared of all clot and debris. Hemostasis was confirmed on all surfaces.  The peritoneum and the muscles were reapproximated using 0 Vicryl interrupted stitches. The fascia was then closed using 0 Vicryl in a running fashion.  The subcutaneous layer was irrigated, and the skin was closed with a 4-0 Vicryl subcuticular stitch.  30 ml of 0.5% Marcaine was injected subcutaneously around the incision. The patient tolerated the procedure well. Sponge, lap, instrument and needle counts were correct x 2.  She was taken to the recovery room in stable condition.

## 2013-02-23 NOTE — Anesthesia Procedure Notes (Signed)
Spinal  Patient location during procedure: OR Start time: 02/23/2013 4:01 PM End time: 02/23/2013 4:03 PM Staffing Anesthesiologist: Sandrea Hughs Performed by: anesthesiologist  Preanesthetic Checklist Completed: patient identified, surgical consent, pre-op evaluation, timeout performed, IV checked, risks and benefits discussed and monitors and equipment checked Spinal Block Patient position: sitting Prep: DuraPrep Patient monitoring: heart rate, cardiac monitor, continuous pulse ox and blood pressure Approach: midline Location: L3-4 Injection technique: single-shot Needle Needle type: Sprotte  Needle gauge: 24 G Needle length: 9 cm Needle insertion depth: 7 cm Assessment Sensory level: T4

## 2013-02-24 ENCOUNTER — Encounter (HOSPITAL_COMMUNITY): Payer: Self-pay | Admitting: Obstetrics & Gynecology

## 2013-02-24 LAB — CBC
HCT: 30 % — ABNORMAL LOW (ref 36.0–46.0)
Hemoglobin: 10.2 g/dL — ABNORMAL LOW (ref 12.0–15.0)
MCH: 29.1 pg (ref 26.0–34.0)
MCHC: 34 g/dL (ref 30.0–36.0)
MCV: 85.5 fL (ref 78.0–100.0)
Platelets: 214 K/uL (ref 150–400)
RBC: 3.51 MIL/uL — ABNORMAL LOW (ref 3.87–5.11)
RDW: 14.4 % (ref 11.5–15.5)
WBC: 29.5 K/uL — ABNORMAL HIGH (ref 4.0–10.5)

## 2013-02-24 LAB — RPR: RPR Ser Ql: NONREACTIVE

## 2013-02-24 NOTE — Progress Notes (Signed)
Postpartum Day 1: Cesarean Delivery  And BTS at 29 weeks secondary to BPP 2/10, concern about chorioamnionitis, breech presentation  Subjective: Patient reports tolerating PO, + flatus and no problems voiding.  Ambulating.  Breast pumping.  Infant stable in NICU.  Objective: Vital signs in last 24 hours: Temp:  [97.2 F (36.2 C)-99.4 F (37.4 C)] 99 F (37.2 C) (06/11 0542) Pulse Rate:  [57-90] 90 (06/11 0542) Resp:  [16-19] 18 (06/11 0542) BP: (104-131)/(53-87) 113/68 mmHg (06/11 0542) SpO2:  [95 %-100 %] 99 % (06/11 0542) Weight:  [257 lb (116.574 kg)-260 lb (117.935 kg)] 257 lb (116.574 kg) (06/10 2000)  Physical Exam:  General: alert and no distress Lochia: appropriate Uterine Fundus: firm Incision: no significant drainage, dressing in place DVT Evaluation: No evidence of DVT seen on physical exam. No significant calf/ankle edema.  Recent Labs  02/23/13 1530 02/24/13 0558  HGB 10.4* 10.2*  HCT 30.8* 30.0*   Assessment/Plan: Status post Cesarean section. Doing well postoperatively.  Continue current care.  Alem Fahl A, MD 02/24/2013, 8:06 AM

## 2013-02-24 NOTE — Anesthesia Postprocedure Evaluation (Signed)
Anesthesia Post Note  Patient: Alexandra Murphy  Procedure(s) Performed: Procedure(s) (LRB): CESAREAN SECTION WITH BILATERAL TUBAL LIGATION (N/A)  Anesthesia type: SAB  Patient location: Mother/Baby  Post pain: Pain level controlled  Post assessment: Post-op Vital signs reviewed  Last Vitals:  Filed Vitals:   02/24/13 1014  BP: 104/64  Pulse:   Temp:   Resp:     Post vital signs: Reviewed  Level of consciousness: awake  Complications: No apparent anesthesia complications

## 2013-02-24 NOTE — Progress Notes (Signed)
Ur chart review completed.  

## 2013-02-24 NOTE — Clinical Social Work Maternal (Addendum)
Clinical Social Work Department PSYCHOSOCIAL ASSESSMENT - MATERNAL/CHILD 02/24/2013  Patient:  Alexandra Murphy,Alexandra Murphy  Account Number:  [id number]  Admit Date:  02/23/2013  Marjo Bicker Name:   Kem Parkinson    Clinical Social Worker:  Lulu Riding, LCSW   Date/Time:  02/24/2013 11:30 AM  Date Referred:  02/24/2013   Referral source  NICU     Referred reason  NICU   Other referral source:    I:  FAMILY / HOME ENVIRONMENT Child's legal guardian:  PARENT  Guardian - Name Guardian - Age Guardian - Address  Moira Umholtz [address], [place], [place] [address]  Letta Kocher 25    Other household support members/support persons Other support:   MOB states she has a good support system of family and friends.  She has an adult daughter and son who live near by.  She states in addition to her children and FOB, her main support people are her mother, pastor's wife, sister and cousins.    II  PSYCHOSOCIAL DATA Information Source:  Patient Interview  Event organiser Employment:   MOB works at [employer]  FOB works for a tree Consulting civil engineer resources:  [employer] If Medicaid - [employer]:  DAVIDSON Other  [facility]  Food Stamps   School / Grade:   Maternity Care Coordinator / Child Services Coordination / Early Interventions:  Cultural issues impacting care:   None indicated.    III  STRENGTHS Strengths  Adequate Resources  Compliance with medical plan  Other - See comment  Supportive family/friends  Understanding of illness   Strength comment:  Pediatric follow up will be at [facility].   IV  RISK FACTORS AND CURRENT PROBLEMS Current Problem:  None   Risk Factor & Current Problem Patient Issue Family Issue Risk Factor / Current Problem Comment   N N     V  SOCIAL WORK ASSESSMENT  CSW met with MOB in her third floor room to complete assessment for NICU admission.  CSW initially met with MOB while she was a patient in Antenatal a month  ago.  MOB was very pleasant, although seemed very sleepy.  She stated this was a good time to talk and states she remembers meeting CSW.  She reports that she was hopeful that baby would stay in utero longer than he did and was disappointed yesterday when she was told she needed to deliver.  CSW pointed out the positive side and that is she has kept baby in almost 4 weeks from when we initially met and she was concerned about delivering baby.  We discussed common emotions related to the NICU experience and the hightened risk for PPD.  CSW addressed MOB's increased anxiety when we initially met due to the loss of her grandson (24 weeker) after 54 days of life last year.  CSW acknowledges that this situation may bring back strong emotions from that experience.  MOB agreed and seemed appreciative that CSW remembered that story and acknowledged it.  MOB understands signs and symptoms to watch for and agreed to talk with CSW if symptoms arise during baby's hospitalization.  We discussed, in general terms, the milestones baby will have to meet before being ready for d/c.  She seems to have a good understanding of the medical situation at this point and appears to be coping well.  She reports FOB is still very supportive and that he had just left.  She states they do not have anything for baby because  she felt she did not want to gather supplies in the event baby did not survive.  She acts as though she feels comfortable getting supplies now, since baby is here and doing well at this time.  She states she has a baby shower planned for March 27, 2013.  CSW explained Family Support Network support services as well as ongoing supports offered by NICU CSW.  CSW offered gas cards, since MOB will be traveling from [place] to visit with baby after her d/c and informed her of the possibility of staying at [facility].  MOB was appreciative.  CSW gave contact information and asked MOB to contact CSW any time.  CSW has no  social concerns at this time.   VI SOCIAL WORK PLAN Social Work Plan  Psychosocial Support/Ongoing Assessment of Needs   Type of pt/family education:   PPD signs and symptoms  What to expect from a NICU admission (in general terms)   If child protective services report - county:   If child protective services report - date:   Information/referral to community resources comment:   No referral needs noted at this time.   Other social work plan:

## 2013-02-25 DIAGNOSIS — Z98891 History of uterine scar from previous surgery: Secondary | ICD-10-CM

## 2013-02-25 DIAGNOSIS — Z9851 Tubal ligation status: Secondary | ICD-10-CM

## 2013-02-25 MED ORDER — OXYCODONE-ACETAMINOPHEN 5-325 MG PO TABS: 2.0000 | ORAL_TABLET | ORAL | Status: DC | PRN

## 2013-02-25 MED ORDER — IBUPROFEN 600 MG PO TABS: 600.0000 mg | ORAL_TABLET | Freq: Four times a day (QID) | ORAL | Status: DC

## 2013-02-25 MED ORDER — SENNOSIDES-DOCUSATE SODIUM 8.6-50 MG PO TABS: 2.0000 | ORAL_TABLET | Freq: Every day | ORAL | Status: DC

## 2013-02-25 MED ORDER — TETANUS-DIPHTH-ACELL PERTUSSIS 5-2.5-18.5 LF-MCG/0.5 IM SUSP
0.5000 mL | Freq: Once | INTRAMUSCULAR | Status: AC
  Administered 2013-02-25: 0.5 mL via INTRAMUSCULAR

## 2013-02-25 NOTE — Progress Notes (Signed)
Subjective: Postpartum Day 2: Cesarean Delivery And BTS at 29 weeks secondary to BPP 2/10, concern about chorioamnionitis, breech presentation  Patient reports incisional pain, tolerating PO, + flatus and no problems voiding.    Objective: Vital signs in last 24 hours: Temp:  [97.7 F (36.5 C)-98.7 F (37.1 C)] 97.9 F (36.6 C) (06/12 0525) Pulse Rate:  [72-88] 82 (06/12 0525) Resp:  [16-18] 16 (06/12 0525) BP: (95-104)/(53-64) 99/64 mmHg (06/12 0525) SpO2:  [97 %-99 %] 97 % (06/12 0525)  Physical Exam:  General: alert, cooperative and no distress Lochia: appropriate Uterine Fundus: firm Incision: dressing clean, dry, intact. DVT Evaluation: No evidence of DVT seen on physical exam. Negative Homan's sign. No cords or calf tenderness.   Recent Labs  02/23/13 1530 02/24/13 0558  HGB 10.4* 10.2*  HCT 30.8* 30.0*    Assessment/Plan: Status post Cesarean section. Doing well postoperatively.  Discharge home with standard precautions and return to clinic in 4-6 weeks.  Everlene Other 02/25/2013, 7:19 AM

## 2013-02-25 NOTE — Progress Notes (Signed)
I saw and examined patient and agree with above resident note. I reviewed history, delivery summary, labs and vitals. Claudine Stallings, MD  

## 2013-02-25 NOTE — Progress Notes (Signed)
Pt education done . She was instructed when and how to remove honey comb dressing and scope patch. Pt ambulated out of unit and had no pain.

## 2013-02-25 NOTE — Discharge Summary (Signed)
Obstetric Discharge Summary Reason for Admission: cesarean section due to chorioamnionitis, BPP 2/10, Breech presentation Prenatal Procedures: ultrasound Intrapartum Procedures: cesarean: low cervical, transverse and tubal ligation Postpartum Procedures: none Complications-Operative and Postpartum: none Hemoglobin  Date Value Range Status  02/24/2013 10.2* 12.0 - 15.0 g/dL Final     HCT  Date Value Range Status  02/24/2013 30.0* 36.0 - 46.0 % Final    Physical Exam:  Filed Vitals:   02/25/13 0525  BP: 99/64  Pulse: 82  Temp: 97.9 F (36.6 C)  Resp: 16    General: alert, cooperative and appears stated age CVS:  RRR, without murmur, gallops, or rubs Lungs:  CTA bilat ABD:  +BSx4, normal Lochia: appropriate Uterine Fundus: firm Incision: no significant drainage, dressing clean, dry, and intact DVT Evaluation: No evidence of DVT seen on physical exam. Negative Homan's sign.  Discharge Diagnoses: Preterm delivery; PLTCS for chorioamnionitis, BPP 2/10, Breech presentation, advanced cervical dilation  Discharge Information: Date: 02/25/2013 Activity: pelvic rest Diet: routine Medications: PNV, Ibuprofen, Colace and Percocet Condition: stable Instructions: refer to practice specific booklet Discharge to: home Follow-up Information   Call [facility]. (To schedule a postpartum appointment)    Contact information:   [address] [place] [address]       Newborn Data: Live born female  Birth Weight: 3 lb 1 oz (1390 g) APGAR: 4, 6  Baby in NICU.  Everlene Other 02/25/2013, 7:24 AM  I examined pt and agree with documentation above and resident plan of care. [facility]

## 2013-02-28 ENCOUNTER — Encounter (HOSPITAL_COMMUNITY): Payer: Self-pay | Admitting: Family

## 2013-02-28 ENCOUNTER — Inpatient Hospital Stay (HOSPITAL_COMMUNITY)
Admission: AD | Admit: 2013-02-28 | Discharge: 2013-02-28 | Disposition: A | Discharge status: Home / Self Care | Payer: Medicaid Other | Source: Ambulatory Visit | Attending: Obstetrics & Gynecology | Admitting: Obstetrics & Gynecology

## 2013-02-28 DIAGNOSIS — G8918 Other acute postprocedural pain: Secondary | ICD-10-CM | POA: Insufficient documentation

## 2013-02-28 DIAGNOSIS — W010XXA Fall on same level from slipping, tripping and stumbling without subsequent striking against object, initial encounter: Secondary | ICD-10-CM | POA: Insufficient documentation

## 2013-02-28 DIAGNOSIS — R109 Unspecified abdominal pain: Secondary | ICD-10-CM | POA: Insufficient documentation

## 2013-02-28 DIAGNOSIS — Y92009 Unspecified place in unspecified non-institutional (private) residence as the place of occurrence of the external cause: Secondary | ICD-10-CM | POA: Insufficient documentation

## 2013-02-28 DIAGNOSIS — O99893 Other specified diseases and conditions complicating puerperium: Secondary | ICD-10-CM | POA: Insufficient documentation

## 2013-02-28 MED ORDER — HYDROMORPHONE HCL 2 MG PO TABS: 2.0000 mg | ORAL_TABLET | ORAL | Status: DC | PRN

## 2013-02-28 MED ORDER — IBUPROFEN 600 MG PO TABS
600.0000 mg | ORAL_TABLET | Freq: Once | ORAL | Status: AC
  Administered 2013-02-28: 600 mg via ORAL
  Filled 2013-02-28: qty 1

## 2013-02-28 MED ORDER — HYDROMORPHONE HCL 2 MG PO TABS
2.0000 mg | ORAL_TABLET | Freq: Once | ORAL | Status: AC
  Administered 2013-02-28: 2 mg via ORAL
  Filled 2013-02-28: qty 1

## 2013-02-28 NOTE — MAU Provider Note (Signed)
Attestation of Attending Supervision of Advanced Practitioner (PA/CNM/NP): Evaluation and management procedures were performed by the Advanced Practitioner under my supervision and collaboration.  I have reviewed the Advanced Practitioner's note and chart, and I agree with the management and plan.  Macintyre Alexa, MD, FACOG Attending Obstetrician & Gynecologist Faculty Practice, [facility]  

## 2013-02-28 NOTE — MAU Provider Note (Signed)
History     CSN: [id number]  Arrival date and time: 02/28/13 1650   None     Chief Complaint  Patient presents with  . Post-op Problem   HPI This is a 37 y.o. female who is s/p C/S 5 days ago. Fell in shower yesterday and pain increased. Has been taking Percocet without relief. Never got ibuprofen filled. No change in bleeding. No fever.   RN Note: Patient presents to MAU with c/o c/s incision pain s/p cesarean 02/23/13. Reports burning sensation on R side of incision. Reports she fell in shower yesterday after she dropped soap.  Denies fever, chills, incisional drainage or odor.       OB History   Grav Para Term Preterm Abortions TAB SAB Ect Mult Living   5 3 2 1 2  0 2 0 0 3      Past Medical History  Diagnosis Date  . Headache(784.0)   . Asthma   . Hypertension   . Varicose veins   . Abnormal Pap smear 10/15/2012    ascus    Past Surgical History  Procedure Laterality Date  . Ganglion cyst excision    . Cesarean section with bilateral tubal ligation N/A 02/23/2013    Procedure: CESAREAN SECTION WITH BILATERAL TUBAL LIGATION;  Surgeon: Tereso Newcomer, MD;  Location: [facility] OR[facility];  Service: Obstetrics;  Laterality: N/A;  Primary Cesarean Section Delivery Baby Boy @ 1631, Apgars     Family History  Problem Relation Age of Onset  . Hypertension Mother   . Hypertension Maternal Grandmother     History  Substance Use Topics  . Smoking status: Current Some Day Smoker -- 0.25 packs/day    Types: Cigarettes  . Smokeless tobacco: Never Used  . Alcohol Use: No    Allergies: No Known Allergies  Prescriptions prior to admission  Medication Sig Dispense Refill  . acetaminophen (TYLENOL) 325 MG tablet Take 650 mg by mouth daily as needed for pain (For headache.).       Marland Kitchen albuterol (PROVENTIL HFA;VENTOLIN HFA) 108 (90 BASE) MCG/ACT inhaler Inhale 2 puffs into the lungs every 6 (six) hours as needed for wheezing or shortness of breath.      Marland Kitchen amLODipine (NORVASC) 5 MG  tablet Take 5 mg by mouth daily.      Marland Kitchen ibuprofen (ADVIL,MOTRIN) 600 MG tablet Take 1 tablet (600 mg total) by mouth every 6 (six) hours.  30 tablet  0  . oxyCODONE-acetaminophen (PERCOCET/ROXICET) 5-325 MG per tablet Take 2 tablets by mouth every 4 (four) hours as needed.  30 tablet  0  . polyethylene glycol (MIRALAX / GLYCOLAX) packet Take 17 g by mouth daily as needed.  30 each  0  . Prenatal Vit-Fe Fumarate-FA (PRENATAL MULTIVITAMIN) TABS Take 1 tablet by mouth daily at 12 noon.      . senna-docusate (SENOKOT-S) 8.6-50 MG per tablet Take 2 tablets by mouth daily. For constipation.  60 tablet  1  . zolpidem (AMBIEN) 5 MG tablet Take 1 tablet (5 mg total) by mouth at bedtime as needed for sleep.  30 tablet  0    Review of Systems  Constitutional: Negative for fever, chills and malaise/fatigue.  Gastrointestinal: Positive for abdominal pain. Negative for nausea, vomiting, diarrhea and constipation.  Neurological: Negative for headaches.   Physical Exam   Blood pressure 119/57, pulse 95, temperature 98.9 F (37.2 C), temperature source Oral, resp. rate 18, currently breastfeeding.  Physical Exam  Constitutional: She is oriented to person, place, and  time. She appears well-developed and well-nourished. No distress.  HENT:  Head: Normocephalic.  Cardiovascular: Normal rate.   Respiratory: Effort normal.  GI: Soft. She exhibits no distension and no mass. There is tenderness. There is no rebound and no guarding.  Dressing still on. No erethema or drainage noted   Musculoskeletal: Normal range of motion.  Neurological: She is alert and oriented to person, place, and time.  Skin: Skin is warm and dry.  Psychiatric: She has a normal mood and affect.    MAU Course  Procedures  MDM Given Dilaudid 2mg  with Ibuprofen >> excellent relief of pain with less dizziness.  Assessment and Plan  A;  Post op C/Section x 5 days       Post op pain      NICU infant  P:  Rx Dilaudid       Rx  Ibuprofen       Discussed how to take them together.  Wynelle Bourgeois 02/28/2013, 5:25 PM

## 2013-02-28 NOTE — MAU Note (Signed)
Patient presents to MAU with c/o c/s incision pain s/p cesarean 02/23/13. Reports burning sensation on R side of incision. Reports she fell in shower yesterday after she dropped soap.  Denies fever, chills, incisional drainage or odor.

## 2013-02-28 NOTE — MAU Note (Addendum)
Pt had c-section on Tues went home on Thursday. C/o  Pain in incision site making it difficult to walk. Taking pain medication without much relief.

## 2013-03-03 NOTE — Discharge Summary (Signed)
Pt understands risk of preterm delivery; detailed instructions given to patient regarding bedrest, contractions, leakage of fluid.  Pt to return to hospital with any problems. Agree with above note.  Delania Ferg H. 03/03/2013 11:46 AM

## 2013-03-04 ENCOUNTER — Ambulatory Visit (INDEPENDENT_AMBULATORY_CARE_PROVIDER_SITE_OTHER): Payer: Medicaid Other | Admitting: Obstetrics & Gynecology

## 2013-03-04 ENCOUNTER — Encounter: Payer: Medicaid Other | Admitting: Obstetrics & Gynecology

## 2013-03-04 ENCOUNTER — Inpatient Hospital Stay (HOSPITAL_COMMUNITY): Payer: Medicaid Other

## 2013-03-04 ENCOUNTER — Inpatient Hospital Stay (HOSPITAL_COMMUNITY)
Admission: AD | Admit: 2013-03-04 | Discharge: 2013-03-08 | DRG: 776 | Disposition: A | Discharge status: Home / Self Care | Payer: Medicaid Other | Source: Ambulatory Visit | Attending: Obstetrics and Gynecology | Admitting: Obstetrics and Gynecology

## 2013-03-04 ENCOUNTER — Encounter: Payer: Self-pay | Admitting: Obstetrics & Gynecology

## 2013-03-04 ENCOUNTER — Encounter (HOSPITAL_COMMUNITY): Payer: Self-pay | Admitting: Radiology

## 2013-03-04 VITALS — BP 127/84 | HR 99 | Temp 99.7°F | Ht 68.0 in | Wt 250.9 lb

## 2013-03-04 DIAGNOSIS — T8140XA Infection following a procedure, unspecified, initial encounter: Secondary | ICD-10-CM

## 2013-03-04 DIAGNOSIS — O909 Complication of the puerperium, unspecified: Principal | ICD-10-CM | POA: Diagnosis present

## 2013-03-04 DIAGNOSIS — T8132XA Disruption of internal operation (surgical) wound, not elsewhere classified, initial encounter: Secondary | ICD-10-CM | POA: Diagnosis present

## 2013-03-04 DIAGNOSIS — O900 Disruption of cesarean delivery wound: Secondary | ICD-10-CM | POA: Diagnosis present

## 2013-03-04 DIAGNOSIS — O9081 Anemia of the puerperium: Secondary | ICD-10-CM | POA: Diagnosis present

## 2013-03-04 DIAGNOSIS — Z09 Encounter for follow-up examination after completed treatment for conditions other than malignant neoplasm: Secondary | ICD-10-CM

## 2013-03-04 DIAGNOSIS — D649 Anemia, unspecified: Secondary | ICD-10-CM | POA: Diagnosis present

## 2013-03-04 DIAGNOSIS — T81329A Deep disruption or dehiscence of operation wound, unspecified, initial encounter: Secondary | ICD-10-CM | POA: Diagnosis present

## 2013-03-04 LAB — CBC
HCT: 30.6 % — ABNORMAL LOW (ref 36.0–46.0)
Hemoglobin: 10.3 g/dL — ABNORMAL LOW (ref 12.0–15.0)
WBC: 20.9 10*3/uL — ABNORMAL HIGH (ref 4.0–10.5)

## 2013-03-04 LAB — COMPREHENSIVE METABOLIC PANEL
CO2: 25 mEq/L (ref 19–32)
Calcium: 8.4 mg/dL (ref 8.4–10.5)
Chloride: 101 mEq/L (ref 96–112)
Creatinine, Ser: 0.81 mg/dL (ref 0.50–1.10)
GFR calc Af Amer: >90 mL/min (ref >90)
Potassium: 3.9 mEq/L (ref 3.5–5.1)
Total Protein: 6.8 g/dL (ref 6.0–8.3)

## 2013-03-04 MED ORDER — IOHEXOL 300 MG/ML  SOLN
100.0000 mL | Freq: Once | INTRAMUSCULAR | Status: AC | PRN
  Administered 2013-03-04: 100 mL via INTRAVENOUS

## 2013-03-04 MED ORDER — SERTRALINE HCL 50 MG PO TABS
50.0000 mg | ORAL_TABLET | Freq: Every day | ORAL | Status: DC
  Administered 2013-03-04 – 2013-03-08 (×5): 50 mg via ORAL
  Filled 2013-03-04 (×6): qty 1

## 2013-03-04 MED ORDER — PRENATAL MULTIVITAMIN CH
1.0000 | ORAL_TABLET | Freq: Every day | ORAL | Status: DC
  Administered 2013-03-05 – 2013-03-08 (×4): 1 via ORAL
  Filled 2013-03-04 (×4): qty 1

## 2013-03-04 MED ORDER — SODIUM CHLORIDE 0.9 % IJ SOLN
INTRAMUSCULAR | Status: AC
  Administered 2013-03-04: 6 mL
  Filled 2013-03-04: qty 6

## 2013-03-04 MED ORDER — IBUPROFEN 600 MG PO TABS
600.0000 mg | ORAL_TABLET | Freq: Four times a day (QID) | ORAL | Status: DC
  Administered 2013-03-04 – 2013-03-08 (×14): 600 mg via ORAL
  Filled 2013-03-04 (×15): qty 1

## 2013-03-04 MED ORDER — HYDROMORPHONE HCL 2 MG PO TABS
2.0000 mg | ORAL_TABLET | ORAL | Status: DC | PRN
  Administered 2013-03-04 – 2013-03-08 (×17): 2 mg via ORAL
  Filled 2013-03-04 (×18): qty 1

## 2013-03-04 MED ORDER — HYDROMORPHONE HCL PF 1 MG/ML IJ SOLN
1.0000 mg | Freq: Once | INTRAMUSCULAR | Status: AC
  Administered 2013-03-04: 1 mg via INTRAVENOUS
  Filled 2013-03-04: qty 1

## 2013-03-04 MED ORDER — IOHEXOL 300 MG/ML  SOLN
50.0000 mL | INTRAMUSCULAR | Status: DC
  Administered 2013-03-04: 50 mL via ORAL

## 2013-03-04 MED ORDER — ALBUTEROL SULFATE HFA 108 (90 BASE) MCG/ACT IN AERS: 2.0000 | INHALATION_SPRAY | Freq: Four times a day (QID) | RESPIRATORY_TRACT | Status: DC | PRN

## 2013-03-04 MED ORDER — PIPERACILLIN-TAZOBACTAM 3.375 G IVPB
3.3750 g | Freq: Three times a day (TID) | INTRAVENOUS | Status: DC
  Administered 2013-03-04 – 2013-03-08 (×12): 3.375 g via INTRAVENOUS
  Filled 2013-03-04 (×14): qty 50

## 2013-03-04 MED ORDER — SODIUM CHLORIDE 0.9 % IV SOLN: INTRAVENOUS | Status: DC

## 2013-03-04 MED ORDER — ZOLPIDEM TARTRATE 5 MG PO TABS
5.0000 mg | ORAL_TABLET | Freq: Every evening | ORAL | Status: DC | PRN
  Administered 2013-03-04 – 2013-03-07 (×4): 5 mg via ORAL
  Filled 2013-03-04 (×4): qty 1

## 2013-03-04 MED ORDER — SODIUM CHLORIDE 0.9 % IV SOLN
INTRAVENOUS | Status: DC
  Administered 2013-03-04: 18:00:00 via INTRAVENOUS

## 2013-03-04 MED ORDER — AMLODIPINE BESYLATE 5 MG PO TABS
5.0000 mg | ORAL_TABLET | Freq: Every day | ORAL | Status: DC
  Administered 2013-03-05 – 2013-03-08 (×4): 5 mg via ORAL
  Filled 2013-03-04 (×5): qty 1

## 2013-03-04 MED ORDER — SODIUM CHLORIDE 0.9 % IJ SOLN
INTRAMUSCULAR | Status: AC
  Filled 2013-03-04: qty 3

## 2013-03-04 NOTE — MAU Provider Note (Signed)
History     CSN: [id number]  Arrival date and time: 03/04/13 1629   None     Chief Complaint  Patient presents with  . postpartum incision abscess    HPI  Pt presents POD#10 from GYN clinic for evaluation of post-op incision resulting from an emergency LTCS on 02/23/13 at 29 wks for chorioamnionitis and a BPP 2/10.   Pt seen in MAU for pain on 02/28/13 and was given Dilaudid. Currently, she says Dilaudid is not helping with her pain. No drainage observed. Reports her abdomen is very touch to touch   Past Medical History  Diagnosis Date  . Headache(784.0)   . Asthma   . Hypertension   . Varicose veins   . Abnormal Pap smear 10/15/2012    ascus    Past Surgical History  Procedure Laterality Date  . Ganglion cyst excision    . Cesarean section with bilateral tubal ligation N/A 02/23/2013    Procedure: CESAREAN SECTION WITH BILATERAL TUBAL LIGATION;  Surgeon: Tereso Newcomer, MD;  Location: [facility];  Service: Obstetrics;  Laterality: N/A;  Primary Cesarean Section Delivery Baby Boy @ 1631, Apgars     Family History  Problem Relation Age of Onset  . Hypertension Mother   . Hypertension Maternal Grandmother     History  Substance Use Topics  . Smoking status: Current Some Day Smoker -- 0.25 packs/day    Types: Cigarettes  . Smokeless tobacco: Never Used  . Alcohol Use: No    Allergies: No Known Allergies  Prescriptions prior to admission  Medication Sig Dispense Refill  . albuterol (PROVENTIL HFA;VENTOLIN HFA) 108 (90 BASE) MCG/ACT inhaler Inhale 2 puffs into the lungs every 6 (six) hours as needed for wheezing or shortness of breath.      Marland Kitchen amLODipine (NORVASC) 5 MG tablet Take 5 mg by mouth daily.      Marland Kitchen HYDROmorphone (DILAUDID) 2 MG tablet Take 1 tablet (2 mg total) by mouth every 4 (four) hours as needed for pain.  30 tablet  0  . ibuprofen (ADVIL,MOTRIN) 600 MG tablet Take 1 tablet (600 mg total) by mouth every 6 (six) hours.  30 tablet  0  . zolpidem (AMBIEN) 5  MG tablet Take 1 tablet (5 mg total) by mouth at bedtime as needed for sleep.  30 tablet  0    ROS See HPI  Physical Exam   Blood pressure 138/78, pulse 93, temperature 98.3 F (36.8 C), temperature source Oral, resp. rate 18, SpO2 99.00%, currently breastfeeding.  Physical Exam  Gen: NAD  Abd: Soft, very tender to palpation around incision and pannus, no pus expressed. Pannus is very indurated and erythematous.  Pelvic: Deferred (Dr. Macon Large exam)  MAU Course  Procedures  Results for orders placed during the hospital encounter of 03/04/13 (from the past 24 hour(s))  COMPREHENSIVE METABOLIC PANEL     Status: Abnormal   Collection Time    03/04/13  5:18 PM      Result Value Range   Sodium 136  135 - 145 mEq/L   Potassium 3.9  3.5 - 5.1 mEq/L   Chloride 101  96 - 112 mEq/L   CO2 25  19 - 32 mEq/L   Glucose, Bld 88  70 - 99 mg/dL   BUN 10  6 - 23 mg/dL   Creatinine, Ser 4.09  0.50 - 1.10 mg/dL   Calcium 8.4  8.4 - 81.1 mg/dL   Total Protein 6.8  6.0 - 8.3 g/dL  Albumin 2.8 (*) 3.5 - 5.2 g/dL   AST 12  0 - 37 U/L   ALT 11  0 - 35 U/L   Alkaline Phosphatase 82  39 - 117 U/L   Total Bilirubin 0.3  0.3 - 1.2 mg/dL   GFR calc non Af Amer >90  >90 mL/min   GFR calc Af Amer >90  >90 mL/min  CBC     Status: Abnormal   Collection Time    03/04/13  5:18 PM      Result Value Range   WBC 20.9 (*) 4.0 - 10.5 K/uL   RBC 3.59 (*) 3.87 - 5.11 MIL/uL   Hemoglobin 10.3 (*) 12.0 - 15.0 g/dL   HCT 81.1 (*) 91.4 - 78.2 %   MCV 85.2  78.0 - 100.0 fL   MCH 28.7  26.0 - 34.0 pg   MCHC 33.7  30.0 - 36.0 g/dL   RDW 95.6  21.3 - 08.6 %   Platelets 307  150 - 400 K/uL   CT Scan: 1. Fluid in the deep subcutaneous tissues of the lower anterior  abdominal wall with regional inflammatory/edematous changes and  overlying skin thickening consistent with wound infection, but no  discrete loculated abscess.  2. No acute intra abdominal process.  3. Left nephrolithiasis without  hydronephrosis.   Assessment and Plan  Post-op Infection  Plan: Admit to Women's Unit IV [facility] for 24-48 hours Reassess in AM  [facility] 03/04/2013, 7:02 PM

## 2013-03-04 NOTE — Patient Instructions (Signed)
Return for any concerns

## 2013-03-04 NOTE — Progress Notes (Signed)
ANTIBIOTIC CONSULT NOTE - INITIAL  Pharmacy Consult for Zosyn Indication: Post-op Wound Infection  No Known Allergies  Patient Measurements:     Vital Signs: Temp: 99.7 F (37.6 C) (06/19 2106) Temp src: Oral (06/19 2106) BP: 133/84 mmHg (06/19 2106) Pulse Rate: 82 (06/19 2106)   Labs:  Recent Labs  03/04/13 1718  WBC 20.9*  HGB 10.3*  PLT 307  CREATININE 0.81   Estimated CrCl based on SCr = 95 ml/min  Microbiology: No results found for this or any previous visit (from the past 720 hour(s)).  Medical History: Past Medical History  Diagnosis Date  . Headache(784.0)   . Asthma   . Hypertension   . Varicose veins   . Abnormal Pap smear 10/15/2012    ascus    Medications:   Assessment: 36yo re-admitted for post-op wound infection s/p LTCS at 25 weeks on 02/23/13.   Goal of Therapy:  Continue Zosyn for wound improvement. No levels or dosage adjustment necessary.  Plan:  1. Zosyn 3.375g IV q8h (4 hour infusion). 2. Will continue to follow -- no further pharmacy intervention necessary with Zosyn dosing. 3. If no improvement in wound healing and MRSA possible, may want to consider adding Vancomycin to regimen.   Alexandra Murphy 03/04/2013,9:23 PM

## 2013-03-04 NOTE — H&P (Signed)
History     CSN: [id number]  Arrival date and time: 03/04/13 1629   None     Chief Complaint  Patient presents with  . postpartum incision abscess    HPI  Pt presents POD#10 from GYN clinic for evaluation of post-op incision resulting from an emergency LTCS on 02/23/13 at 29 wks for chorioamnionitis and a BPP 2/10.   Pt seen in MAU for pain on 02/28/13 and was given Dilaudid. Currently, she says Dilaudid is not helping with her pain. No drainage observed. Reports her abdomen is very touch to touch   Past Medical History  Diagnosis Date  . Headache(784.0)   . Asthma   . Hypertension   . Varicose veins   . Abnormal Pap smear 10/15/2012    ascus    Past Surgical History  Procedure Laterality Date  . Ganglion cyst excision    . Cesarean section with bilateral tubal ligation N/A 02/23/2013    Procedure: CESAREAN SECTION WITH BILATERAL TUBAL LIGATION;  Surgeon: Tereso Newcomer, MD;  Location: [facility];  Service: Obstetrics;  Laterality: N/A;  Primary Cesarean Section Delivery Baby Boy @ 1631, Apgars     Family History  Problem Relation Age of Onset  . Hypertension Mother   . Hypertension Maternal Grandmother     History  Substance Use Topics  . Smoking status: Current Some Day Smoker -- 0.25 packs/day    Types: Cigarettes  . Smokeless tobacco: Never Used  . Alcohol Use: No    Allergies: No Known Allergies  Prescriptions prior to admission  Medication Sig Dispense Refill  . albuterol (PROVENTIL HFA;VENTOLIN HFA) 108 (90 BASE) MCG/ACT inhaler Inhale 2 puffs into the lungs every 6 (six) hours as needed for wheezing or shortness of breath.      Marland Kitchen amLODipine (NORVASC) 5 MG tablet Take 5 mg by mouth daily.      Marland Kitchen HYDROmorphone (DILAUDID) 2 MG tablet Take 1 tablet (2 mg total) by mouth every 4 (four) hours as needed for pain.  30 tablet  0  . ibuprofen (ADVIL,MOTRIN) 600 MG tablet Take 1 tablet (600 mg total) by mouth every 6 (six) hours.  30 tablet  0  . zolpidem (AMBIEN) 5  MG tablet Take 1 tablet (5 mg total) by mouth at bedtime as needed for sleep.  30 tablet  0    ROS See HPI  Physical Exam   Blood pressure 138/78, pulse 93, temperature 98.3 F (36.8 C), temperature source Oral, resp. rate 18, SpO2 99.00%, currently breastfeeding.  Physical Exam Physical Exam  Constitutional: She is oriented to person, place, and time. She appears well-developed and well-nourished.  uncomfortable  HENT:  Head: Normocephalic and atraumatic.  Neck: Normal range of motion. Neck supple.  Cardiovascular: Normal rate, regular rhythm and normal heart sounds.   Pulmonary/Chest: Effort normal and breath sounds normal. No respiratory distress.  Musculoskeletal: Normal range of motion. She exhibits edema (bilat 1+ pedal edema).  Neurological: She is alert and oriented to person, place, and time.  Skin: Skin is warm and dry.  Abd: Soft, very tender to palpation around incision and pannus, no pus expressed. Pannus is very indurated and erythematous.  Pelvic: Deferred   MAU Course  Procedures  Results for orders placed during the hospital encounter of 03/04/13 (from the past 24 hour(s))  COMPREHENSIVE METABOLIC PANEL     Status: Abnormal   Collection Time    03/04/13  5:18 PM      Result Value Range  Sodium 136  135 - 145 mEq/L   Potassium 3.9  3.5 - 5.1 mEq/L   Chloride 101  96 - 112 mEq/L   CO2 25  19 - 32 mEq/L   Glucose, Bld 88  70 - 99 mg/dL   BUN 10  6 - 23 mg/dL   Creatinine, Ser 4.09  0.50 - 1.10 mg/dL   Calcium 8.4  8.4 - 81.1 mg/dL   Total Protein 6.8  6.0 - 8.3 g/dL   Albumin 2.8 (*) 3.5 - 5.2 g/dL   AST 12  0 - 37 U/L   ALT 11  0 - 35 U/L   Alkaline Phosphatase 82  39 - 117 U/L   Total Bilirubin 0.3  0.3 - 1.2 mg/dL   GFR calc non Af Amer >90  >90 mL/min   GFR calc Af Amer >90  >90 mL/min  CBC     Status: Abnormal   Collection Time    03/04/13  5:18 PM      Result Value Range   WBC 20.9 (*) 4.0 - 10.5 K/uL   RBC 3.59 (*) 3.87 - 5.11 MIL/uL    Hemoglobin 10.3 (*) 12.0 - 15.0 g/dL   HCT 91.4 (*) 78.2 - 95.6 %   MCV 85.2  78.0 - 100.0 fL   MCH 28.7  26.0 - 34.0 pg   MCHC 33.7  30.0 - 36.0 g/dL   RDW 21.3  08.6 - 57.8 %   Platelets 307  150 - 400 K/uL   CT Scan: 1. Fluid in the deep subcutaneous tissues of the lower anterior  abdominal wall with regional inflammatory/edematous changes and  overlying skin thickening consistent with wound infection, but no  discrete loculated abscess.  2. No acute intra abdominal process.  3. Left nephrolithiasis without hydronephrosis.   Assessment and Plan  Post-op Infection  Plan: Admit to Women's Unit IV Zosyn for 24-48 hours Reassess in AM  [facility] 03/04/2013, 7:02 PM

## 2013-03-04 NOTE — Progress Notes (Signed)
GYNECOLOGY CLINIC PROGRESS NOTE  History:  37 y.o. [id number] here today for evaluation of incsiion s/p LTCS on 02/23/13.  She was seen in MAU for pain on 02/28/13 and was given Dilaudid.  Currently, she says Dilaudid is not helping with her pain. No drainage observed.  Reports her abdomen is very touch to touch.  The following portions of the patient's history were reviewed and updated as appropriate: allergies, current medications, past family history, past medical history, past social history, past surgical history and problem list.  Review of Systems:  Pertinent items are noted in HPI.  Objective:  Physical Exam BP 127/84  Pulse 99  Temp(Src) 99.7 F (37.6 C) (Oral)  Ht 5\' 8"  (1.727 m)  Wt 250 lb 14.4 oz (113.807 kg)  BMI 38.16 kg/m2  Breastfeeding? Yes Gen: NAD Abd: Soft, very tender to palpation around incision and pannus, no pus expressed.  Pannus is very indurated and erythematous. Pelvic: Deferred  Assessment & Plan:  Will send to MAU for CT scan for evaluation for possible abscess May need IV antibiotics On call staff and MAU notified

## 2013-03-04 NOTE — Progress Notes (Signed)
walidah cnm notified of patient arrival. She is aware of her c/o of possible abscess of c-section site. Will order ct scan

## 2013-03-05 DIAGNOSIS — D649 Anemia, unspecified: Secondary | ICD-10-CM

## 2013-03-05 DIAGNOSIS — O900 Disruption of cesarean delivery wound: Secondary | ICD-10-CM

## 2013-03-05 DIAGNOSIS — O909 Complication of the puerperium, unspecified: Principal | ICD-10-CM

## 2013-03-05 DIAGNOSIS — O9081 Anemia of the puerperium: Secondary | ICD-10-CM

## 2013-03-05 LAB — CBC
Hemoglobin: 9.5 g/dL — ABNORMAL LOW (ref 12.0–15.0)
MCH: 28.8 pg (ref 26.0–34.0)
MCV: 85.2 fL (ref 78.0–100.0)
RBC: 3.3 MIL/uL — ABNORMAL LOW (ref 3.87–5.11)

## 2013-03-05 MED ORDER — PROMETHAZINE HCL 25 MG/ML IJ SOLN: 25.0000 mg | Freq: Four times a day (QID) | INTRAMUSCULAR | Status: DC | PRN

## 2013-03-05 NOTE — Progress Notes (Signed)
While patient was using the bathroom her C/S low transverse incision drain copious amount of serosanguinous and slightly foul smelling drainage.  Patient claim, " after that drainage came out I feel a lot better and my pain is almost gone."  Will continue to monitor.

## 2013-03-05 NOTE — Progress Notes (Signed)
Patient ID: Alexandra Murphy, female   DOB: [birth year], 38 y.o.   MRN: [id number] Patient reports significant improvement in her pain since admission. She states that her incision started to drain last night while she went to the bathroom and that is when her pain improved.  Filed Vitals:   03/05/13 0500  BP: 133/85  Pulse: 82  Temp: 97.9 F (36.6 C)  Resp: 18   Abd: soft, non-distended, obese, tenderness surrounding incision.   Incision: 0.5 cm of skin separation on left aspect of incision with purulent drainage. Attempted to further open incision but could only open it to 1 cm. Underlying fascia is intact. Ext: NT, equal in size  A/P 37 yo [id number] admitted with wound infection - Clinical status slowly improving - Continue IV antibiotics - Continue zoloft per patient request- patient has been overwhelmed with the fact that infant is in the NICU. Her last grandson remained in the NICU for close to 3 months before he died. Patient is fearful that the same thing will happen again. Emotional support provided. Will obtain a psychiatry consult

## 2013-03-05 NOTE — Progress Notes (Signed)
UR completed 

## 2013-03-05 NOTE — Progress Notes (Signed)
Alexandra Murphy reported that she is feeling anxious.  She has had a difficult time since the middle of her pregnancy and she has not been able to sleep well.  Now, with her baby in the NICU, she reports that it has been an Personal assistant.  She has a strong faith and good family support and that has helped her through this time to some extent, but she remains anxious.  I did not have the opportunity to follow up further about her anxiety because her daughter came into the room.  I offered prayer and she and her daughter were very appreciative.  [employer] Pager, [phone] 3:17 PM   03/05/13 1500  Clinical Encounter Type  Visited With Patient  Visit Type Spiritual support  Referral From Nurse  Spiritual Encounters  Spiritual Needs Emotional;Prayer

## 2013-03-06 MED ORDER — DIPHENHYDRAMINE HCL 25 MG PO CAPS
25.0000 mg | ORAL_CAPSULE | Freq: Four times a day (QID) | ORAL | Status: DC | PRN
  Administered 2013-03-06: 25 mg via ORAL
  Filled 2013-03-06: qty 1

## 2013-03-06 NOTE — Progress Notes (Addendum)
POD #11 s/p C/S  Subjective: Patient reports incisional pain, tolerating PO and no problems voiding.  Her pain is improving gradually. She is going to the NICU to visit her baby regularly  Objective: I have reviewed patient's vital signs, intake and output, medications, labs and microbiology.  General: alert Resp: clear to auscultation bilaterally Cardio: regular rate and rhythm, S1, S2 normal, no murmur, click, rub or gallop GI: soft, non-tender; bowel sounds normal; no masses,  no organomegaly Incision: brownish pus flowing freely from the small opening in her incision. The erythema extends up about 8 cm into the large amount of SQ tissue, quite tender to palpation Assessment: POD #12 s/p C/S- now on IV Zosyn for wound infection. Improvement noted. Check CBC in AM  Plan: Encourage ambulation Continue Zosyn  LOS: 2 days    Alexandra Murphy C. 03/06/2013, 8:39 AM

## 2013-03-07 LAB — CBC
HCT: 28.1 % — ABNORMAL LOW (ref 36.0–46.0)
MCH: 28.7 pg (ref 26.0–34.0)
MCV: 85.7 fL (ref 78.0–100.0)
Platelets: 367 10*3/uL (ref 150–400)
RDW: 14.9 % (ref 11.5–15.5)

## 2013-03-07 MED ORDER — LABETALOL HCL 5 MG/ML IV SOLN
INTRAVENOUS | Status: AC
  Filled 2013-03-07: qty 4

## 2013-03-07 NOTE — Lactation Note (Signed)
This note was copied from the chart of Alexandra Murphy. Lactation Consultation Note  Patient Name: Alexandra Murphy VHQIO'N Date: 03/07/2013 Reason for consult: Follow-up assessment   Maternal Data    Feeding   LATCH Score/Interventions                      Lactation Tools Discussed/Used     Consult Status Consult Status: PRN  Experienced BF mom reports that pumping is going well- she is able to keep up with his demand. Pumping q 3 hours. No questions at present. To call prn.  Pamelia Hoit 03/07/2013, 10:53 AM

## 2013-03-07 NOTE — Progress Notes (Signed)
POD #12 s/p c/s  Subjective: Patient reports incisional pain, tolerating PO and no problems voiding.    Objective: I have reviewed patient's vital signs, intake and output, medications and labs.  General: alert Resp: clear to auscultation bilaterally Cardio: regular rate and rhythm, S1, S2 normal, no murmur, click, rub or gallop GI: soft, non-tender; bowel sounds normal; no masses,  no organomegaly Incision: decreased erythema, decreased amount of drainage, still exquisitely tender at right inferior edge of incision WBC- 7.9 (was 20) Assessment: s/p C/S: stable and progressing well  Plan: continue Zosyn  LOS: 3 days    Alexandra Murphy C. 03/07/2013, 7:15 AM

## 2013-03-08 ENCOUNTER — Encounter (HOSPITAL_COMMUNITY): Payer: Self-pay | Admitting: *Deleted

## 2013-03-08 DIAGNOSIS — D649 Anemia, unspecified: Secondary | ICD-10-CM | POA: Diagnosis present

## 2013-03-08 DIAGNOSIS — T81329A Deep disruption or dehiscence of operation wound, unspecified, initial encounter: Secondary | ICD-10-CM | POA: Diagnosis present

## 2013-03-08 DIAGNOSIS — T8132XA Disruption of internal operation (surgical) wound, not elsewhere classified, initial encounter: Secondary | ICD-10-CM | POA: Diagnosis present

## 2013-03-08 MED ORDER — DOCUSATE SODIUM 100 MG PO CAPS
100.0000 mg | ORAL_CAPSULE | Freq: Two times a day (BID) | ORAL | Status: DC
  Administered 2013-03-08: 100 mg via ORAL
  Filled 2013-03-08: qty 1

## 2013-03-08 MED ORDER — FERROUS SULFATE 325 (65 FE) MG PO TABS
325.0000 mg | ORAL_TABLET | Freq: Every day | ORAL | Status: DC
  Administered 2013-03-08: 325 mg via ORAL
  Filled 2013-03-08: qty 1

## 2013-03-08 MED ORDER — AMOXICILLIN-POT CLAVULANATE 875-125 MG PO TABS: 1.0000 | ORAL_TABLET | Freq: Two times a day (BID) | ORAL | Status: DC

## 2013-03-08 NOTE — Progress Notes (Signed)
Abd drsg removed to assess low transverse incision. 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

## 2013-03-08 NOTE — Care Management Note (Signed)
  Page 2 of 2   03/08/2013     4:01:07 PM   CARE MANAGEMENT NOTE 03/08/2013  Patient:  Wishart,Jowana   Account Number:  [id number]  Date Initiated:  03/08/2013  Documentation initiated by:  Emilio Math  Subjective/Objective Assessment:   POD#10- from Csection on 02/23/13 with infection and drainage from wound.     Action/Plan:   Anticipated DC Date:  03/08/2013   Anticipated DC Plan:  HOME W HOME HEALTH SERVICES  In-house referral  Chaplain      DC Planning Services  CM consult      [facility] Choice  HOME HEALTH   Choice offered to / List presented to:  C-1 Patient   DME arranged  [facility]      DME agency  [facility]     [facility] arranged  HH-1 RN      [facility] [place] agency  [facility]   Status of service:  Completed, signed off  Discharge Disposition:  HOME W HOME HEALTH SERVICES  Per UR Regulation:  Reviewed for med. necessity/level of care/duration of stay  If discussed at Long Length of Stay Meetings, dates discussed:    Comments:  03/08/13 1100 L. Floyce Stakes [facility] BSN # [phone]- referal for Wound VAC.  CM saw patient when Melody the WOC RN was in room.  No preference by MD of which negative pressure wound therapy to use.  [facility] unable to provide their system ( for patient for NPWT) so [facility] called and forms filled out for [facility] wound VAC for home use. Forms faxed to [facility] at # [phone].  Patient had no preference of Home Health Agencies so referred to [facility] to [facility] for RN services [phone], patient verbalized understanding and agreement with plan.  Spoke to Roy at [facility] in the [place] office around 3:00 pm # [phone] and she stated the [facility] had been approved and will be delivered by 7 pm to patient 's room #306 at [facility]. Kristen with [facility] notified of today's planned discharge.  Per WOC RN  Melody she stated that patient's dressing will not need to be changed until patient goes back to see MD in office within for removal. Baxter Hire made  aware of orders.  RN home services from [facility]  will provide [facility] monitoring and education.  Dr. Shawnie Pons notified that [facility] will be delivered to patient's room this afternoon.  Please call #[phone] Nurse Care Manager for any questions or needs.

## 2013-03-08 NOTE — Progress Notes (Addendum)
Subjective: Patient reports drainage form incison.  No fevers, rigors or chills,  Abdominal pain is better  Objective: I have reviewed patient's vital signs, medications and labs.  General: alert, cooperative and no distress GI: soft, nontender Extremities: Homans sign is negative, no sign of DVT Vaginal Bleeding: minimal Incision has 2 areas of drainage.  Induration is less.  Erythema is markedly decreased.  Assessment/Plan: 37 yo female with wound disruption s/p emergent c/s 1-Continue abx 2-Wound care consult for vacuum 3-will transition to oral antibiotics and hope to d/c home today  4-iron for anemia  LOS: 4 days    Alexandra Murphy H. 03/08/2013, 6:03 AM

## 2013-03-08 NOTE — Progress Notes (Signed)
Ur chart review completed.  

## 2013-03-08 NOTE — Discharge Summary (Signed)
Physician Discharge Summary  Patient ID: Alexandra Murphy MRN: [id number] DOB/AGE: 05-22-1976 37 y.o.  Admit date: 03/04/2013 Discharge date: 03/08/2013   Discharge Diagnoses:  Active Problems:   Disruption of internal operation (surgical) wound   Anemia   Consults: None  Significant Diagnostic Studies: labs: WBC 20.9-->7.9    CBC    Component Value Date/Time   WBC 7.9 03/07/2013 0510   RBC 3.28* 03/07/2013 0510   HGB 9.4* 03/07/2013 0510   HCT 28.1* 03/07/2013 0510   PLT 367 03/07/2013 0510   MCV 85.7 03/07/2013 0510   MCH 28.7 03/07/2013 0510   MCHC 33.5 03/07/2013 0510   RDW 14.9 03/07/2013 0510   LYMPHSABS 2.4 02/23/2013 1530   MONOABS 1.2* 02/23/2013 1530   EOSABS 0.2 02/23/2013 1530   BASOSABS 0.0 02/23/2013 1530   CMP     Component Value Date/Time   NA 136 03/04/2013 1718   K 3.9 03/04/2013 1718   CL 101 03/04/2013 1718   CO2 25 03/04/2013 1718   GLUCOSE 88 03/04/2013 1718   BUN 10 03/04/2013 1718   CREATININE 0.81 03/04/2013 1718   CALCIUM 8.4 03/04/2013 1718   PROT 6.8 03/04/2013 1718   ALBUMIN 2.8* 03/04/2013 1718   AST 12 03/04/2013 1718   ALT 11 03/04/2013 1718   ALKPHOS 82 03/04/2013 1718   BILITOT 0.3 03/04/2013 1718   GFRNONAA >90 03/04/2013 1718   GFRAA >90 03/04/2013 1718      Ct Abdomen Pelvis W Contrast  03/04/2013   *RADIOLOGY REPORT*  Clinical Data: Postpartum incisional infection  CT ABDOMEN AND PELVIS WITH CONTRAST  Technique:  Multidetector CT imaging of the abdomen and pelvis was performed following the standard protocol during bolus administration of intravenous contrast.  Contrast: OMNIPAQUE IOHEXOL 300 MG/ML  SOLN  Comparison: None.  Findings: Linear subsegmental atelectasis in the visualized lung bases.  4 mm low attenuation lesion in the hepatic segment 3 probably cyst but nonspecific.  No other liver lesion. Unremarkable spleen, adrenal glands, pancreas, abdominal aorta.  2 mm calculus in the upper pole left renal collecting system.  Right kidney  unremarkable.  No hydronephrosis.  Stomach, small bowel, and colon are nondilated.  Usual appearance of postpartum uterus. Bilateral tubal ligation clips are noted.  3 cm left ovarian cyst. Urinary bladder incompletely distended.  No ascites.  No free air. Small umbilical hernia containing only mesenteric fat.  There is poorly marginated  fluid in the deep subcutaneous tissues of the lower anterior abdominal wall at the presumed level of the previous incision, measuring approximately 15 x 22 x 104 mm. No convincing loculation or significant peripheral enhancement is evident.  There is a small bubble of gas in the deep subcutaneous tissues just superficial to the fascia at the far left lateral margin of the incision region, image 63/2.  There are regional inflammatory/edematous changes in the subcutaneous fat and some overlying skin thickening.  Varicose veins in the proximal left thigh incidentally noted.  IMPRESSION:  1.  Fluid in the deep subcutaneous tissues of the lower anterior abdominal wall with regional inflammatory/edematous changes and overlying skin thickening consistent with wound infection, but no discrete loculated abscess. 2.  No acute intra abdominal process. 3.  Left nephrolithiasis without hydronephrosis.   Original Report Authenticated By: D. Andria Rhein, MD     Hospital Course: Admitted 6/19 with wound infection.  Started on IV Zosyn and CT as above.  Quickly defervesced.  Had wound probed and was found to have some dehisence and it  was opened, but would not stay open for drainage.  Wound/ostomy team consulted and applied [facility] wound vac and pt. Was deemed ready for d/c.    Disposition: 01-Home or Self Care  Discharged Condition: improved  Discharge Orders   Future Appointments Provider Department Dept Phone   04/01/2013 1:30 PM Deirdre Colin Mulders, CNM [facility] [phone]   Future Orders Complete By Expires     Diet - low sodium heart healthy  As directed     Discharge  wound care:  As directed     Comments:      Leave wound vac in place    Increase activity slowly  As directed     Lifting restrictions  As directed     Comments:      Nothing > 20 lbs x 4-6 wks    Sexual Activity Restrictions  As directed     Comments:      None x 4 wks        Medication List    TAKE these medications       albuterol 108 (90 BASE) MCG/ACT inhaler  Commonly known as:  PROVENTIL HFA;VENTOLIN HFA  Inhale 2 puffs into the lungs every 6 (six) hours as needed for wheezing or shortness of breath.     amLODipine 5 MG tablet  Commonly known as:  NORVASC  Take 5 mg by mouth daily.     amoxicillin-clavulanate 875-125 MG per tablet  Commonly known as:  AUGMENTIN  Take 1 tablet by mouth 2 (two) times daily.     HYDROmorphone 2 MG tablet  Commonly known as:  DILAUDID  Take 1 tablet (2 mg total) by mouth every 4 (four) hours as needed for pain.     ibuprofen 600 MG tablet  Commonly known as:  ADVIL,MOTRIN  Take 1 tablet (600 mg total) by mouth every 6 (six) hours.     zolpidem 5 MG tablet  Commonly known as:  AMBIEN  Take 1 tablet (5 mg total) by mouth at bedtime as needed for sleep.           Follow-up Information   Follow up with [facility] In 1 week. (wound check)    Contact information:   [address] [place] [address] [phone]      Signed: Reva Bores 03/08/2013, 6:14 PM

## 2013-03-08 NOTE — H&P (Signed)
Attestation of Attending Supervision of Advanced Practitioner (CNM/NP): Evaluation and management procedures were performed by the Advanced Practitioner under my supervision and collaboration.  I have reviewed the Advanced Practitioner's note and chart, and I agree with the management and plan.  Nazeer Romney 03/08/2013 12:15 PM

## 2013-03-08 NOTE — MAU Provider Note (Signed)
Attestation of Attending Supervision of Advanced Practitioner (CNM/NP): Evaluation and management procedures were performed by the Advanced Practitioner under my supervision and collaboration.  I have reviewed the Advanced Practitioner's note and chart, and I agree with the management and plan.  Brand Siever 03/08/2013 12:15 PM   

## 2013-03-08 NOTE — Progress Notes (Signed)
Wound VAC delivered to patient's room # 306.

## 2013-03-08 NOTE — Consult Note (Signed)
WOC consult Note Reason for Consult: evaluation for NPWT for C-Section wound Wound type: C-section wound with 2 openings at the distal ends of the wound Measurement: Wound bed:0.2cmx 6cm x 0.5cm (distal ends) Drainage (amount, consistency, odor) serous Periwound:intact Dressing procedure/placement/frequency: place NPWT along incision and over open areas for drainage control and to lessen chance of further formation of seroma.  Would recommend [facility] for monitoring of the NPWT device and education.  Will not need dressing changes, would leave in place until MD follow up for removal.   Re consult if needed, will not follow at this time. Thanks  Serenna Deroy [employer], CWOCN [phone])

## 2013-03-08 NOTE — Progress Notes (Signed)
Pt discharged to home with friend. Condition stable.  Pt to car via wheelchair with this RN.  Pt home with negative pressure dressing applied and attached to home wound vac from [facility].  Pt completed return demonstration as to use of home wound vac during discharge instructions.  Pt also home with box of supplies delivered by [employer] this afternoon.  Pt aware that home health RN will see her to check wound and dressing status.  No other equipment for home ordered at discharge.

## 2013-03-11 NOTE — Progress Notes (Signed)
This encounter was created in error - please disregard.

## 2013-03-12 ENCOUNTER — Encounter: Payer: Self-pay | Admitting: Obstetrics and Gynecology

## 2013-03-12 ENCOUNTER — Ambulatory Visit (INDEPENDENT_AMBULATORY_CARE_PROVIDER_SITE_OTHER): Payer: Medicaid Other | Admitting: Obstetrics and Gynecology

## 2013-03-12 VITALS — BP 146/93 | Temp 97.6°F | Ht 68.0 in | Wt 250.0 lb

## 2013-03-12 DIAGNOSIS — Z09 Encounter for follow-up examination after completed treatment for conditions other than malignant neoplasm: Secondary | ICD-10-CM

## 2013-03-12 DIAGNOSIS — T8132XA Disruption of internal operation (surgical) wound, not elsewhere classified, initial encounter: Secondary | ICD-10-CM

## 2013-03-12 NOTE — Progress Notes (Signed)
Patient ID: Alexandra Murphy, female   DOB: [birth year], 37 y.o.   MRN: [id number] 37 yo [id number] s/p c-section on 6/10 secondary to chorioamnionitis at 29 weeks readmitted on 6/19 with wound infection presenting today for wound check and wound vac removal. Patient reports feeling better since discharged from the hospital and states that the canister has not changed in volume over the past 2-3 days.  GENERAL: Well-developed, well-nourished female in no acute distress.  ABDOMEN: Soft, nontender, nondistended.  Incision: healing very well. No erythema, induration or drainage. 1 cm area of skin separation cannot probe a q-tip throught it EXTREMITIES: No cyanosis, clubbing, or edema, 2+ distal pulses.  A/P 37 yo with post op wound infection  - Incision healing well - Continue oral antibiotics - Advised to keep area dry at all times - Patient states she feels much better in terms of postpartum depression and does not feel that she needs Zoloft anymore - RTC for postpartum visit on 7/17

## 2013-03-25 ENCOUNTER — Telehealth: Payer: Self-pay

## 2013-03-25 NOTE — Telephone Encounter (Signed)
Pt called and stated that she needs something to help her milk come back so she can feed her baby.

## 2013-03-29 ENCOUNTER — Encounter: Payer: Self-pay | Admitting: Obstetrics & Gynecology

## 2013-03-29 NOTE — Progress Notes (Signed)
Patient ID: Alexandra Murphy, female   DOB: [birth year], 37 y.o.   MRN: [id number] F/U for short cervix. Few contractions, no ROM, bleeding. Cervical exam stable from previous. Given PTL precautions.  Adam Phenix, MD

## 2013-03-31 NOTE — Telephone Encounter (Signed)
Returned pt's call and left message that I am calling to discuss her concerns. Please call back and leave a new message if she is still having problems or questions.

## 2013-04-01 ENCOUNTER — Ambulatory Visit: Payer: Medicaid Other | Admitting: Obstetrics and Gynecology

## 2013-04-16 ENCOUNTER — Encounter: Payer: Self-pay | Admitting: *Deleted

## 2013-05-24 ENCOUNTER — Encounter: Payer: Self-pay | Admitting: *Deleted

## 2014-07-18 ENCOUNTER — Encounter: Payer: Self-pay | Admitting: Obstetrics & Gynecology

## 2016-12-09 ENCOUNTER — Encounter (HOSPITAL_BASED_OUTPATIENT_CLINIC_OR_DEPARTMENT_OTHER): Payer: Self-pay

## 2016-12-09 ENCOUNTER — Emergency Department (HOSPITAL_BASED_OUTPATIENT_CLINIC_OR_DEPARTMENT_OTHER)
Admission: EM | Admit: 2016-12-09 | Discharge: 2016-12-09 | Disposition: A | Discharge status: Home / Self Care | Payer: Medicaid Other | Attending: Emergency Medicine | Admitting: Emergency Medicine

## 2016-12-09 DIAGNOSIS — R1084 Generalized abdominal pain: Secondary | ICD-10-CM | POA: Insufficient documentation

## 2016-12-09 DIAGNOSIS — J45909 Unspecified asthma, uncomplicated: Secondary | ICD-10-CM | POA: Insufficient documentation

## 2016-12-09 DIAGNOSIS — R112 Nausea with vomiting, unspecified: Secondary | ICD-10-CM

## 2016-12-09 DIAGNOSIS — F17210 Nicotine dependence, cigarettes, uncomplicated: Secondary | ICD-10-CM | POA: Insufficient documentation

## 2016-12-09 DIAGNOSIS — R197 Diarrhea, unspecified: Secondary | ICD-10-CM | POA: Insufficient documentation

## 2016-12-09 DIAGNOSIS — I10 Essential (primary) hypertension: Secondary | ICD-10-CM | POA: Insufficient documentation

## 2016-12-09 LAB — CBC WITH DIFFERENTIAL/PLATELET
BASOS PCT: 0 %
Basophils Absolute: 0 10*3/uL (ref 0.0–0.1)
EOS ABS: 0 10*3/uL (ref 0.0–0.7)
Eosinophils Relative: 1 %
HCT: 34.3 % — ABNORMAL LOW (ref 36.0–46.0)
HEMOGLOBIN: 11.6 g/dL — AB (ref 12.0–15.0)
Lymphocytes Relative: 24 %
Lymphs Abs: 0.9 10*3/uL (ref 0.7–4.0)
MCH: 27.6 pg (ref 26.0–34.0)
MCHC: 33.8 g/dL (ref 30.0–36.0)
MCV: 81.7 fL (ref 78.0–100.0)
MONOS PCT: 17 %
Monocytes Absolute: 0.7 10*3/uL (ref 0.1–1.0)
NEUTROS PCT: 58 %
Neutro Abs: 2.3 10*3/uL (ref 1.7–7.7)
Platelets: 293 10*3/uL (ref 150–400)
RBC: 4.2 MIL/uL (ref 3.87–5.11)
RDW: 15.7 % — ABNORMAL HIGH (ref 11.5–15.5)
WBC: 3.9 10*3/uL — AB (ref 4.0–10.5)

## 2016-12-09 LAB — URINALYSIS, MICROSCOPIC (REFLEX)

## 2016-12-09 LAB — URINALYSIS, ROUTINE W REFLEX MICROSCOPIC
GLUCOSE, UA: NEGATIVE mg/dL
HGB URINE DIPSTICK: NEGATIVE
Ketones, ur: NEGATIVE mg/dL
Nitrite: NEGATIVE
PH: 7 (ref 5.0–8.0)
Protein, ur: 30 mg/dL — AB
Specific Gravity, Urine: 1.026 (ref 1.005–1.030)

## 2016-12-09 LAB — COMPREHENSIVE METABOLIC PANEL
ALK PHOS: 68 U/L (ref 38–126)
ALT: 21 U/L (ref 14–54)
ANION GAP: 8 (ref 5–15)
AST: 30 U/L (ref 15–41)
Albumin: 3.5 g/dL (ref 3.5–5.0)
BILIRUBIN TOTAL: 0.3 mg/dL (ref 0.3–1.2)
BUN: 17 mg/dL (ref 6–20)
CALCIUM: 8.3 mg/dL — AB (ref 8.9–10.3)
CO2: 24 mmol/L (ref 22–32)
CREATININE: 0.92 mg/dL (ref 0.44–1.00)
Chloride: 102 mmol/L (ref 101–111)
Glucose, Bld: 89 mg/dL (ref 65–99)
Potassium: 3.5 mmol/L (ref 3.5–5.1)
Sodium: 134 mmol/L — ABNORMAL LOW (ref 135–145)
TOTAL PROTEIN: 7.2 g/dL (ref 6.5–8.1)

## 2016-12-09 LAB — LIPASE, BLOOD: LIPASE: 24 U/L (ref 11–51)

## 2016-12-09 LAB — PREGNANCY, URINE: Preg Test, Ur: NEGATIVE

## 2016-12-09 MED ORDER — SODIUM CHLORIDE 0.9 % IV BOLUS (SEPSIS)
1000.0000 mL | Freq: Once | INTRAVENOUS | Status: AC
  Administered 2016-12-09: 1000 mL via INTRAVENOUS

## 2016-12-09 MED ORDER — ONDANSETRON 4 MG PO TBDP: 4.0000 mg | ORAL_TABLET | Freq: Three times a day (TID) | ORAL | 0 refills | Status: DC | PRN

## 2016-12-09 MED ORDER — ONDANSETRON HCL 4 MG/2ML IJ SOLN
4.0000 mg | Freq: Once | INTRAMUSCULAR | Status: AC
Start: 2016-12-09 — End: 2016-12-09
  Administered 2016-12-09: 4 mg via INTRAVENOUS
  Filled 2016-12-09: qty 2

## 2016-12-09 NOTE — Discharge Instructions (Signed)
Please read and follow all provided instructions.  Your diagnoses today include:  1. Nausea vomiting and diarrhea     Tests performed today include:  Blood counts and electrolytes  Blood tests to check liver and kidney function  Blood tests to check pancreas function  Urine test to look for infection and pregnancy (in women)  Vital signs. See below for your results today.   Medications prescribed:   Zofran (ondansetron) - for nausea and vomiting  Take any prescribed medications only as directed.  Home care instructions:   Follow any educational materials contained in this packet.   Your abdominal pain, nausea, vomiting, and diarrhea may be caused by a viral gastroenteritis also called 'stomach flu'. You should rest for the next several days. Keep drinking plenty of fluids and use the medicine for nausea as directed.    Drink clear liquids for the next 24 hours and introduce solid foods slowly after 24 hours using the b.r.a.t. diet (Bananas, Rice, Applesauce, Toast, Yogurt).    Follow-up instructions: Please follow-up with your primary care provider in the next 2 days for further evaluation of your symptoms. If you are not feeling better in 48 hours you may have a condition that is more serious and you need re-evaluation.   Return instructions:  SEEK IMMEDIATE MEDICAL ATTENTION IF:  If you have pain that does not go away or becomes severe   A temperature above 101F develops   Repeated vomiting occurs (multiple episodes)   If you have pain that becomes localized to portions of the abdomen. The right side could possibly be appendicitis. In an adult, the left lower portion of the abdomen could be colitis or diverticulitis.   Blood is being passed in stools or vomit (bright red or black tarry stools)   You develop chest pain, difficulty breathing, dizziness or fainting, or become confused, poorly responsive, or inconsolable (young children)  If you have any other emergent  concerns regarding your health  Additional Information: Abdominal (belly) pain can be caused by many things. Your caregiver performed an examination and possibly ordered blood/urine tests and imaging (CT scan, x-rays, ultrasound). Many cases can be observed and treated at home after initial evaluation in the emergency department. Even though you are being discharged home, abdominal pain can be unpredictable. Therefore, you need a repeated exam if your pain does not resolve, returns, or worsens. Most patients with abdominal pain don't have to be admitted to the hospital or have surgery, but serious problems like appendicitis and gallbladder attacks can start out as nonspecific pain. Many abdominal conditions cannot be diagnosed in one visit, so follow-up evaluations are very important.  Your vital signs today were: BP (!) 141/83 (BP Location: Left Arm)    Pulse (!) 101    Temp 98.2 F (36.8 C) (Oral)    Resp 16    Ht  (1.727 m)    Wt 113.4 kg    LMP 11/25/2016    SpO2 98%    BMI 38.01 kg/m  If your blood pressure (bp) was elevated above 135/85 this visit, please have this repeated by your doctor within one month. --------------

## 2016-12-09 NOTE — ED Notes (Signed)
States has had 6 diarrhea stools since yesterday and no appetite

## 2016-12-09 NOTE — ED Triage Notes (Signed)
Pt reports n/v/d since Friday - now has generalized abdominal pain. States unable to tolerate solid food.

## 2016-12-09 NOTE — ED Notes (Signed)
States," I feel much better" Given sprite for fluid challenge

## 2016-12-09 NOTE — ED Provider Notes (Signed)
[facility]   CSN: [id number] Arrival date & time: 12/09/16  1309     History   Chief Complaint Chief Complaint  Patient presents with  . Abdominal Pain    HPI Alexandra Murphy is a 41 y.o. female.  Patient with history of cesarean section, no other abdominal surgeries presents with complaint of nausea, vomiting, and diarrhea. Patient started with generalized nausea 3 days ago. 2 days ago she developed vomiting and yesterday she developed diarrhea. Patient states that she has been unable to keep down solids and liquids since that time. No focal abdominal pain. No urinary symptoms. No vaginal bleeding or discharge. Patient takes NSAIDs daily. No alcohol use. No recent antibiotic use. No known sick contacts however patient feels that she has a stomach virus. Patient has taken Phenergan and Reglan from a friend with some temporary relief. The onset of this condition was acute. The course is constant. Aggravating factors: none. Alleviating factors: none.        Past Medical History:  Diagnosis Date  . Abnormal Pap smear 10/15/2012   ascus  . Asthma   . Headache(784.0)   . Hypertension   . Varicose veins     Patient Active Problem List   Diagnosis Date Noted  . Disruption of internal operation (surgical) wound 03/08/2013  . Anemia 03/08/2013  . S/P cesarean section 02/25/2013  . S/P tubal ligation 02/25/2013  . Short cervix affecting pregnancy with delivery 02/22/2013  . Supervision of high-risk pregnancy 02/22/2013  . Chronic hypertension complicating or reason for care during pregnancy 01/29/2013    Past Surgical History:  Procedure Laterality Date  . CESAREAN SECTION WITH BILATERAL TUBAL LIGATION N/A 02/23/2013   Procedure: CESAREAN SECTION WITH BILATERAL TUBAL LIGATION;  Surgeon: Tereso Newcomer, MD;  Location: [facility];  Service: Obstetrics;  Laterality: N/A;  Primary Cesarean Section Delivery Baby Boy @ 1631, Apgars   . GANGLION CYST EXCISION       OB History    Gravida Para Term Preterm AB Living   5 3 2 1 2 3    SAB TAB Ectopic Multiple Live Births   2 0 0 0 3       Home Medications    Prior to Admission medications   Medication Sig Start Date End Date Taking? Authorizing Provider  albuterol (PROVENTIL HFA;VENTOLIN HFA) 108 (90 BASE) MCG/ACT inhaler Inhale 2 puffs into the lungs every 6 (six) hours as needed for wheezing or shortness of breath.    Historical Provider, MD  amLODipine (NORVASC) 5 MG tablet Take 5 mg by mouth daily.    Historical Provider, MD  amoxicillin-clavulanate (AUGMENTIN) 875-125 MG per tablet Take 1 tablet by mouth 2 (two) times daily. 03/08/13   Reva Bores, MD  HYDROmorphone (DILAUDID) 2 MG tablet Take 1 tablet (2 mg total) by mouth every 4 (four) hours as needed for pain. 02/28/13   Aviva Signs, CNM  ibuprofen (ADVIL,MOTRIN) 600 MG tablet Take 1 tablet (600 mg total) by mouth every 6 (six) hours. 02/25/13   Tommie Sams, DO  zolpidem (AMBIEN) 5 MG tablet Take 1 tablet (5 mg total) by mouth at bedtime as needed for sleep. 02/15/13   Napoleon Form, MD    Family History Family History  Problem Relation Age of Onset  . Hypertension Mother   . Hypertension Maternal Grandmother     Social History Social History  Substance Use Topics  . Smoking status: Current Some Day Smoker    Packs/day: 0.25  Types: Cigarettes  . Smokeless tobacco: Never Used  . Alcohol use Yes     Comment: occasional     Allergies   Patient has no known allergies.   Review of Systems Review of Systems  Constitutional: Negative for appetite change and fever.  HENT: Negative for rhinorrhea and sore throat.   Eyes: Negative for redness.  Respiratory: Negative for cough.   Cardiovascular: Negative for chest pain.  Gastrointestinal: Positive for abdominal pain, diarrhea, nausea and vomiting. Negative for blood in stool.       Negative for hematemesis  Genitourinary: Negative for dysuria, vaginal bleeding and  vaginal discharge.  Musculoskeletal: Negative for myalgias.  Skin: Negative for rash.  Neurological: Negative for light-headedness.     Physical Exam Updated Vital Signs BP (!) 141/83 (BP Location: Left Arm)   Pulse (!) 101   Temp 98.2 F (36.8 C) (Oral)   Resp 16   Ht 5\' 8"  (1.727 m)   Wt 113.4 kg   LMP 11/25/2016   SpO2 98%   BMI 38.01 kg/m   Physical Exam  Constitutional: She appears well-developed and well-nourished.  HENT:  Head: Normocephalic and atraumatic.  Mouth/Throat: Oropharynx is clear and moist.  Eyes: Conjunctivae are normal. Right eye exhibits no discharge. Left eye exhibits no discharge.  Neck: Normal range of motion. Neck supple.  Cardiovascular: Normal rate, regular rhythm and normal heart sounds.   Pulmonary/Chest: Effort normal and breath sounds normal. No respiratory distress. She has no wheezes. She has no rales.  Abdominal: Soft. Bowel sounds are normal. There is tenderness (Generalized, minimal). There is no rebound and no guarding.  Neurological: She is alert.  Skin: Skin is warm and dry.  Psychiatric: She has a normal mood and affect.  Nursing note and vitals reviewed.    ED Treatments / Results  Labs (all labs ordered are listed, but only abnormal results are displayed) Labs Reviewed  URINALYSIS, ROUTINE W REFLEX MICROSCOPIC - Abnormal; Notable for the following:       Result Value   APPearance CLOUDY (*)    Bilirubin Urine SMALL (*)    Protein, ur 30 (*)    Leukocytes, UA TRACE (*)    All other components within normal limits  CBC WITH DIFFERENTIAL/PLATELET - Abnormal; Notable for the following:    WBC 3.9 (*)    Hemoglobin 11.6 (*)    HCT 34.3 (*)    RDW 15.7 (*)    All other components within normal limits  COMPREHENSIVE METABOLIC PANEL - Abnormal; Notable for the following:    Sodium 134 (*)    Calcium 8.3 (*)    All other components within normal limits  URINALYSIS, MICROSCOPIC (REFLEX) - Abnormal; Notable for the following:     Bacteria, UA FEW (*)    Squamous Epithelial / LPF 6-30 (*)    All other components within normal limits  PREGNANCY, URINE  LIPASE, BLOOD    Procedures Procedures (including critical care time)  Medications Ordered in ED Medications  sodium chloride 0.9 % bolus 1,000 mL (0 mLs Intravenous Stopped 12/09/16 1512)  ondansetron (ZOFRAN) injection 4 mg (4 mg Intravenous Given 12/09/16 1416)     Initial Impression / Assessment and Plan / ED Course  I have reviewed the triage vital signs and the nursing notes.  Pertinent labs & imaging results that were available during my care of the patient were reviewed by me and considered in my medical decision making (see chart for details).     Patient seen  and examined. Work-up initiated. Medications ordered.   Vital signs reviewed and are as follows: BP (!) 141/83 (BP Location: Left Arm)   Pulse (!) 101   Temp 98.2 F (36.8 C) (Oral)   Resp 16   Ht 5\' 8"  (1.727 m)   Wt 113.4 kg   LMP 11/25/2016   SpO2 98%   BMI 38.01 kg/m   3:31 PM patient reevaluated. She has had no further vomiting or diarrhea while in emergency department and is sipping on soda in the room. She states that she feels better. Abdomen remains soft without significant tenderness.  Will discharge to home with Zofran.  The patient was urged to return to the Emergency Department immediately with worsening of current symptoms, worsening abdominal pain, persistent vomiting, blood noted in stools, fever, or any other concerns. The patient verbalized understanding.    Final Clinical Impressions(s) / ED Diagnoses   Final diagnoses:  Nausea vomiting and diarrhea   Patient with symptoms consistent with viral gastroenteritis. Vitals are stable, no fever.  No signs of dehydration, tolerating PO's. Lungs are clear. No focal abdominal pain. No findings to suggest need for imaging currently. Low concern for appendicitis, cholecystitis, pancreatitis, ruptured viscus, UTI, kidney  stone, aortic dissection, aortic aneurysm or other emergent abdominal etiology. Supportive therapy indicated with return if symptoms worsen. Patient counseled.   New Prescriptions New Prescriptions   ONDANSETRON (ZOFRAN ODT) 4 MG DISINTEGRATING TABLET    Take 1 tablet (4 mg total) by mouth every 8 (eight) hours as needed for nausea or vomiting.     Renne Crigler, PA-C 12/09/16 1533    Maia Plan, MD 12/10/16 [phone]

## 2017-04-03 ENCOUNTER — Encounter (HOSPITAL_BASED_OUTPATIENT_CLINIC_OR_DEPARTMENT_OTHER): Payer: Self-pay | Admitting: *Deleted

## 2017-04-03 ENCOUNTER — Emergency Department (HOSPITAL_BASED_OUTPATIENT_CLINIC_OR_DEPARTMENT_OTHER)
Admission: EM | Admit: 2017-04-03 | Discharge: 2017-04-03 | Disposition: A | Discharge status: Home / Self Care | Payer: Medicaid Other | Attending: Physician Assistant | Admitting: Physician Assistant

## 2017-04-03 DIAGNOSIS — J45909 Unspecified asthma, uncomplicated: Secondary | ICD-10-CM | POA: Insufficient documentation

## 2017-04-03 DIAGNOSIS — Z79899 Other long term (current) drug therapy: Secondary | ICD-10-CM | POA: Insufficient documentation

## 2017-04-03 DIAGNOSIS — F17210 Nicotine dependence, cigarettes, uncomplicated: Secondary | ICD-10-CM | POA: Insufficient documentation

## 2017-04-03 DIAGNOSIS — I10 Essential (primary) hypertension: Secondary | ICD-10-CM | POA: Insufficient documentation

## 2017-04-03 DIAGNOSIS — J4521 Mild intermittent asthma with (acute) exacerbation: Secondary | ICD-10-CM | POA: Insufficient documentation

## 2017-04-03 MED ORDER — ALBUTEROL SULFATE (2.5 MG/3ML) 0.083% IN NEBU
INHALATION_SOLUTION | RESPIRATORY_TRACT | Status: AC
  Administered 2017-04-03: 2.5 mg
  Filled 2017-04-03: qty 3

## 2017-04-03 MED ORDER — ALBUTEROL SULFATE HFA 108 (90 BASE) MCG/ACT IN AERS
2.0000 | INHALATION_SPRAY | Freq: Once | RESPIRATORY_TRACT | Status: AC
  Administered 2017-04-03: 2 via RESPIRATORY_TRACT
  Filled 2017-04-03: qty 6.7

## 2017-04-03 MED ORDER — PREDNISONE 20 MG PO TABS
40.0000 mg | ORAL_TABLET | Freq: Once | ORAL | Status: AC
  Administered 2017-04-03: 40 mg via ORAL
  Filled 2017-04-03: qty 2

## 2017-04-03 MED ORDER — BUDESONIDE-FORMOTEROL FUMARATE 80-4.5 MCG/ACT IN AERO: 2.0000 | INHALATION_SPRAY | Freq: Every day | RESPIRATORY_TRACT | 12 refills | Status: DC

## 2017-04-03 MED ORDER — ALBUTEROL SULFATE (5 MG/ML) 0.5% IN NEBU: 2.5000 mg | INHALATION_SOLUTION | Freq: Four times a day (QID) | RESPIRATORY_TRACT | 12 refills | Status: DC | PRN

## 2017-04-03 MED ORDER — ACETAMINOPHEN 325 MG PO TABS
650.0000 mg | ORAL_TABLET | Freq: Once | ORAL | Status: AC
  Administered 2017-04-03: 650 mg via ORAL
  Filled 2017-04-03: qty 2

## 2017-04-03 MED ORDER — IPRATROPIUM-ALBUTEROL 0.5-2.5 (3) MG/3ML IN SOLN
RESPIRATORY_TRACT | Status: AC
  Administered 2017-04-03: 3 mL
  Filled 2017-04-03: qty 3

## 2017-04-03 MED ORDER — PREDNISONE 10 MG PO TABS: 40.0000 mg | ORAL_TABLET | Freq: Every day | ORAL | 0 refills | Status: AC

## 2017-04-03 MED ORDER — IBUPROFEN 400 MG PO TABS
400.0000 mg | ORAL_TABLET | Freq: Once | ORAL | Status: AC
Start: 2017-04-03 — End: 2017-04-03
  Administered 2017-04-03: 400 mg via ORAL
  Filled 2017-04-03: qty 1

## 2017-04-03 NOTE — ED Notes (Signed)
Pt c/o HA and inquiring for MD.

## 2017-04-03 NOTE — ED Notes (Signed)
ED Provider at bedside. 

## 2017-04-03 NOTE — Discharge Instructions (Signed)
To find a primary care or specialty doctor please call [phone] or [phone] to access "[facility] Find a Doctor Service." ° °You may also go on the [facility] website at [email or web address] ° °There are also multiple [facility] and [facility] practices throughout the [place] that are frequently accepting new patients. You may find a clinic that is close to your home and contact them. ° °[facility] -  °[address] °[place] [address] °[phone] ° °[facility] in [place] (also locations in [place] and [place]) -  °[address] °[place] [address] °[phone] ° °[facility] -  °[address] °[place] [address] °[phone] ° ° °

## 2017-04-03 NOTE — ED Triage Notes (Signed)
Pt c/o hx asthma with SOB x 4 days with no relief from albuterol.

## 2017-04-03 NOTE — ED Notes (Signed)
Instructed MDI with spacer. Patient had not been using spacer in the past. We also talked about symibort and why, how and when to use it. No further questions at this time

## 2017-04-03 NOTE — ED Provider Notes (Signed)
[facility] Provider Note   CSN: [id number] Arrival date & time: 04/03/17  1305     History   Chief Complaint Chief Complaint  Patient presents with  . Shortness of Breath    HPI Alexandra Murphy is a 41 y.o. female.  HPI  Patient is a 41 year old female presenting with asthma. Patient reports that she no longer has any albuterol at home. She reports she's been worse for the last several days. No fever. She reports is her usual asthma exacerbation.  She is no longer in any insurance and would like to be started on an inhaler for everyday use, she also would like a steroid burst for this asthma exacerbation.   Past Medical History:  Diagnosis Date  . Abnormal Pap smear 10/15/2012   ascus  . Asthma   . Headache(784.0)   . Hypertension   . Varicose veins     Patient Active Problem List   Diagnosis Date Noted  . Disruption of internal operation (surgical) wound 03/08/2013  . Anemia 03/08/2013  . S/P cesarean section 02/25/2013  . S/P tubal ligation 02/25/2013  . Short cervix affecting pregnancy with delivery 02/22/2013  . Supervision of high-risk pregnancy 02/22/2013  . Chronic hypertension complicating or reason for care during pregnancy 01/29/2013    Past Surgical History:  Procedure Laterality Date  . CESAREAN SECTION WITH BILATERAL TUBAL LIGATION N/A 02/23/2013   Procedure: CESAREAN SECTION WITH BILATERAL TUBAL LIGATION;  Surgeon: Tereso NewcomerUgonna A [name], MD;  Location: [facility];  Service: Obstetrics;  Laterality: N/A;  Primary Cesarean Section Delivery Baby Boy @ 1631, Apgars   . GANGLION CYST EXCISION      OB History    Gravida Para Term Preterm AB Living   5 3 2 1 2 3    SAB TAB Ectopic Multiple Live Births   2 0 0 0 3       Home Medications    Prior to Admission medications   Medication Sig Start Date End Date Taking? Authorizing Provider  albuterol (PROVENTIL HFA;VENTOLIN HFA) 108 (90 BASE) MCG/ACT inhaler Inhale 2 puffs into the lungs every 6  (six) hours as needed for wheezing or shortness of breath.    [provider]  albuterol (PROVENTIL) (5 MG/ML) 0.5% nebulizer solution Take 0.5 mLs (2.5 mg total) by nebulization every 6 (six) hours as needed for wheezing or shortness of breath. 04/03/17   Mackuen, Courteney Lyn, MD  amLODipine (NORVASC) 5 MG tablet Take 5 mg by mouth daily.    [provider]  budesonide-formoterol (SYMBICORT) 80-4.5 MCG/ACT inhaler Inhale 2 puffs into the lungs daily. 04/03/17   Mackuen, Courteney Lyn, MD  HYDROmorphone (DILAUDID) 2 MG tablet Take 1 tablet (2 mg total) by mouth every 4 (four) hours as needed for pain. 02/28/13   Aviva SignsWilliams, Marie L, CNM  ibuprofen (ADVIL,MOTRIN) 600 MG tablet Take 1 tablet (600 mg total) by mouth every 6 (six) hours. 02/25/13   Tommie Samsook, Jayce G, DO  ondansetron (ZOFRAN ODT) 4 MG disintegrating tablet Take 1 tablet (4 mg total) by mouth every 8 (eight) hours as needed for nausea or vomiting. 12/09/16   Renne CriglerGeiple, Joshua, PA-C  predniSONE (DELTASONE) 10 MG tablet Take 4 tablets (40 mg total) by mouth daily with breakfast. 04/03/17 04/07/17  Mackuen, Courteney Lyn, MD  zolpidem (AMBIEN) 5 MG tablet Take 1 tablet (5 mg total) by mouth at bedtime as needed for sleep. 02/15/13   Napoleon FormFerry, Pamela, MD    Family History Family History  Problem Relation Age of  Onset  . Hypertension Mother   . Hypertension Maternal Grandmother     Social History Social History  Substance Use Topics  . Smoking status: Current Some Day Smoker    Packs/day: 0.25    Types: Cigarettes  . Smokeless tobacco: Never Used  . Alcohol use Yes     Comment: occasional     Allergies   Patient has no known allergies.   Review of Systems Review of Systems  Constitutional: Negative for activity change.  Respiratory: Positive for cough and chest tightness. Negative for shortness of breath.   Cardiovascular: Negative for chest pain.  Gastrointestinal: Negative for abdominal pain.  Neurological: Positive for  headaches.     Physical Exam Updated Vital Signs BP (!) 147/89   Pulse 66   Temp 98.5 F (36.9 C) (Oral)   Resp 16   Ht 5\' 7"  (1.702 m)   Wt 114.3 kg (252 lb)   LMP 03/20/2017   SpO2 99%   BMI 39.47 kg/m   Physical Exam  Constitutional: She is oriented to person, place, and time. She appears well-developed and well-nourished.  HENT:  Head: Normocephalic and atraumatic.  Eyes: Right eye exhibits no discharge. Left eye exhibits no discharge.  Cardiovascular: Normal rate and regular rhythm.   Pulmonary/Chest: Effort normal and breath sounds normal. No respiratory distress. She has no wheezes.  Abdominal: Soft. She exhibits no distension. There is no tenderness.  Neurological: She is oriented to person, place, and time.  Skin: Skin is warm and dry. She is not diaphoretic.  Psychiatric: She has a normal mood and affect.  Nursing note and vitals reviewed.    ED Treatments / Results  Labs (all labs ordered are listed, but only abnormal results are displayed) Labs Reviewed - No data to display  EKG  EKG Interpretation None       Radiology No results found.  Procedures Procedures (including critical care time)  Medications Ordered in ED Medications  albuterol (PROVENTIL HFA;VENTOLIN HFA) 108 (90 Base) MCG/ACT inhaler 2 puff (not administered)  predniSONE (DELTASONE) tablet 40 mg (not administered)  albuterol (PROVENTIL) (2.5 MG/3ML) 0.083% nebulizer solution (2.5 mg  Given 04/03/17 1320)  ipratropium-albuterol (DUONEB) 0.5-2.5 (3) MG/3ML nebulizer solution (3 mLs  Given 04/03/17 1320)     Initial Impression / Assessment and Plan / ED Course  I have reviewed the triage vital signs and the nursing notes.  Pertinent labs & imaging results that were available during my care of the patient were reviewed by me and considered in my medical decision making (see chart for details).    Patient is a 41 year old female presenting with asthma exacerbation. Patient reports she  ran out of medication home. We'll prescribe her albuterol nhalor, Symbicort, patient would also like efills on her nebulizer materials, prednisone burst. Gave her a list of primary cares  to follow-up with. Offer patient an x-ray but she  Would not one at at this time.  Final Clinical Impressions(s) / ED Diagnoses   Final diagnoses:  Mild intermittent asthma with acute exacerbation    New Prescriptions New Prescriptions   ALBUTEROL (PROVENTIL) (5 MG/ML) 0.5% NEBULIZER SOLUTION    Take 0.5 mLs (2.5 mg total) by nebulization every 6 (six) hours as needed for wheezing or shortness of breath.   BUDESONIDE-FORMOTEROL (SYMBICORT) 80-4.5 MCG/ACT INHALER    Inhale 2 puffs into the lungs daily.   PREDNISONE (DELTASONE) 10 MG TABLET    Take 4 tablets (40 mg total) by mouth daily with breakfast.  Abelino Derrick, MD 04/03/17 1446

## 2017-05-07 ENCOUNTER — Emergency Department (HOSPITAL_BASED_OUTPATIENT_CLINIC_OR_DEPARTMENT_OTHER)
Admission: EM | Admit: 2017-05-07 | Discharge: 2017-05-07 | Disposition: A | Discharge status: Home / Self Care | Payer: Self-pay | Attending: Emergency Medicine | Admitting: Emergency Medicine

## 2017-05-07 ENCOUNTER — Emergency Department (HOSPITAL_BASED_OUTPATIENT_CLINIC_OR_DEPARTMENT_OTHER): Payer: Self-pay

## 2017-05-07 ENCOUNTER — Encounter (HOSPITAL_BASED_OUTPATIENT_CLINIC_OR_DEPARTMENT_OTHER): Payer: Self-pay

## 2017-05-07 DIAGNOSIS — J45909 Unspecified asthma, uncomplicated: Secondary | ICD-10-CM | POA: Insufficient documentation

## 2017-05-07 DIAGNOSIS — F17210 Nicotine dependence, cigarettes, uncomplicated: Secondary | ICD-10-CM | POA: Insufficient documentation

## 2017-05-07 DIAGNOSIS — I10 Essential (primary) hypertension: Secondary | ICD-10-CM | POA: Insufficient documentation

## 2017-05-07 DIAGNOSIS — M79675 Pain in left toe(s): Secondary | ICD-10-CM | POA: Insufficient documentation

## 2017-05-07 DIAGNOSIS — Z79899 Other long term (current) drug therapy: Secondary | ICD-10-CM | POA: Insufficient documentation

## 2017-05-07 MED ORDER — IBUPROFEN 400 MG PO TABS
400.0000 mg | ORAL_TABLET | Freq: Once | ORAL | Status: AC | PRN
  Administered 2017-05-07: 400 mg via ORAL
  Filled 2017-05-07: qty 1

## 2017-05-07 MED ORDER — NAPROXEN 500 MG PO TABS: 500.0000 mg | ORAL_TABLET | Freq: Two times a day (BID) | ORAL | 0 refills | Status: DC

## 2017-05-07 NOTE — ED Notes (Signed)
Pt verbalizes understanding of d/c instructions and denies any further needs at this time. 

## 2017-05-07 NOTE — ED Triage Notes (Signed)
Pt states she fell/injured left great toe last night-NAD-steady limping gait

## 2017-05-07 NOTE — Discharge Instructions (Signed)
Take naproxen twice a day  Use ice as needed for pain and swelling. Try to rest her foot as much as possible. Use shoes that have good support/thick soles. Follow-up with your primary care doctor in 1 week if you're still experiencing pain. Return to the emergency room if you develop worsening pain, numbness, spreading pain, or any new or worsening symptoms.

## 2017-05-07 NOTE — ED Provider Notes (Signed)
[facility] Provider [facility]   CSN: [id number] Arrival date & time: 05/07/17  1832     History   Chief Complaint Chief Complaint  Patient presents with  . Toe Injury    HPI Alexandra Murphy is a 41 y.o. female presenting with L dorsal toe pain.   Pt states that she slipped while walking last night, and her L foot got caught under her when she landed. She had acute onset L toe pain. She took 800 mg ibuprofen last night without relief. She is ambulatory with pain, She denies numbness or tingling, she denies injury eleswhere. She is not taking blood thinners.   HPI  Past Medical History:  Diagnosis Date  . Abnormal Pap smear 10/15/2012   ascus  . Asthma   . Headache(784.0)   . Hypertension   . Varicose veins     Patient Active Problem List   Diagnosis Date Noted  . Disruption of internal operation (surgical) wound 03/08/2013  . Anemia 03/08/2013  . S/P cesarean section 02/25/2013  . S/P tubal ligation 02/25/2013  . Short cervix affecting pregnancy with delivery 02/22/2013  . Supervision of high-risk pregnancy 02/22/2013  . Chronic hypertension complicating or reason for care during pregnancy 01/29/2013    Past Surgical History:  Procedure Laterality Date  . CESAREAN SECTION WITH BILATERAL TUBAL LIGATION N/A 02/23/2013   Procedure: CESAREAN SECTION WITH BILATERAL TUBAL LIGATION;  Surgeon: Tereso Newcomer, MD;  Location: [facility];  Service: Obstetrics;  Laterality: N/A;  Primary Cesarean Section Delivery Baby Boy @ 1631, Apgars   . GANGLION CYST EXCISION      OB History    Gravida Para Term Preterm AB Living   5 3 2 1 2 3    SAB TAB Ectopic Multiple Live Births   2 0 0 0 3       Home Medications    Prior to Admission medications   Medication Sig Start Date End Date Taking? Authorizing Provider  albuterol (PROVENTIL HFA;VENTOLIN HFA) 108 (90 BASE) MCG/ACT inhaler Inhale 2 puffs into the lungs every 6 (six) hours as needed for wheezing or shortness of  breath.    [provider]  albuterol (PROVENTIL) (5 MG/ML) 0.5% nebulizer solution Take 0.5 mLs (2.5 mg total) by nebulization every 6 (six) hours as needed for wheezing or shortness of breath. 04/03/17   Mackuen, Courteney Lyn, MD  amLODipine (NORVASC) 5 MG tablet Take 5 mg by mouth daily.    [provider]  budesonide-formoterol (SYMBICORT) 80-4.5 MCG/ACT inhaler Inhale 2 puffs into the lungs daily. 04/03/17   Mackuen, Courteney Lyn, MD  naproxen (NAPROSYN) 500 MG tablet Take 1 tablet (500 mg total) by mouth 2 (two) times daily with a meal. 05/07/17   Zeanna Sunde, PA-C    Family History Family History  Problem Relation Age of Onset  . Hypertension Mother   . Hypertension Maternal Grandmother     Social History Social History  Substance Use Topics  . Smoking status: Current Some Day Smoker    Packs/day: 0.25    Types: Cigarettes  . Smokeless tobacco: Never Used  . Alcohol use Yes     Comment: occasional     Allergies   Patient has no known allergies.   Review of Systems Review of Systems  Musculoskeletal: Positive for arthralgias.  Skin: Negative for wound.  Neurological: Negative for numbness.     Physical Exam Updated Vital Signs BP (!) 149/91 (BP Location: Left Arm)   Pulse 79   Temp  98.2 F (36.8 C) (Oral)   Resp 20   Ht 5\' 7"  (1.702 m)   Wt 111 kg (244 lb 11.4 oz)   LMP 04/29/2017   SpO2 100%   BMI 38.33 kg/m   Physical Exam  Constitutional: She is oriented to person, place, and time. She appears well-developed and well-nourished. No distress.  HENT:  Head: Normocephalic and atraumatic.  Eyes: EOM are normal.  Neck: Normal range of motion.  Pulmonary/Chest: Effort normal.  Abdominal: She exhibits no distension.  Musculoskeletal: Normal range of motion.  Minimal swelling of L great toe. No obvious laceration or contusion. Full active ROM of toes and ankle. Pt is ambulatory. TTP of the MTP and great toe. No TTP of other toes or  foot. Sensation intact. Pedal pulses equal bilaterally. Color and warmth equal bilaterally. Compartments soft.  Neurological: She is alert and oriented to person, place, and time.  Skin: Skin is warm. No rash noted.  Psychiatric: She has a normal mood and affect.  Nursing note and vitals reviewed.    ED Treatments / Results  Labs (all labs ordered are listed, but only abnormal results are displayed) Labs Reviewed - No data to display  EKG  EKG Interpretation None       Radiology Dg Toe Great Left  Result Date: 05/07/2017 CLINICAL DATA:  Left great toe hyperextension injury yesterday due to a fall down stairs. Initial encounter. EXAM: LEFT GREAT TOE COMPARISON:  None. FINDINGS: There is no evidence of fracture or dislocation. There is no evidence of arthropathy or other focal bone abnormality. Soft tissues are unremarkable. IMPRESSION: Negative exam. Electronically Signed   By: Drusilla Kanner M.D.   On: 05/07/2017 18:55    Procedures Procedures (including critical care time)  Medications Ordered in ED Medications  ibuprofen (ADVIL,MOTRIN) tablet 400 mg (400 mg Oral Given 05/07/17 1956)     Initial Impression / Assessment and Plan / ED Course  I have reviewed the triage vital signs and the nursing notes.  Pertinent labs & imaging results that were available during my care of the patient were reviewed by me and considered in my medical decision making (see chart for details).     Pt presenting with L toe pain after a mechanical fall last night. Physical exam reassuring, as pt neurovascularly intact. Xray negative for fx or dislocation. Likely muscular/ligamentous injury. Discussed conservative tx with ice, NSAIDs, and rest. Pt to f/u with PCP in 1 wk if pain does not improve. Pt appears safe for discharge. Return precautions given. Pt states she understands and agrees to plan.   Final Clinical Impressions(s) / ED Diagnoses   Final diagnoses:  Pain of toe of left foot     New Prescriptions Discharge Medication List as of 05/07/2017  8:23 PM    START taking these medications   Details  naproxen (NAPROSYN) 500 MG tablet Take 1 tablet (500 mg total) by mouth 2 (two) times daily with a meal., Starting Wed 05/07/2017, Print         [place], [place], PA-C 05/08/17 0257    Loren Racer, MD 05/09/17 1537

## 2017-11-22 ENCOUNTER — Other Ambulatory Visit: Payer: Self-pay

## 2017-11-22 ENCOUNTER — Emergency Department (HOSPITAL_BASED_OUTPATIENT_CLINIC_OR_DEPARTMENT_OTHER)
Admission: EM | Admit: 2017-11-22 | Discharge: 2017-11-22 | Disposition: A | Discharge status: Home / Self Care | Payer: Self-pay | Attending: Emergency Medicine | Admitting: Emergency Medicine

## 2017-11-22 ENCOUNTER — Emergency Department (HOSPITAL_BASED_OUTPATIENT_CLINIC_OR_DEPARTMENT_OTHER): Payer: Self-pay

## 2017-11-22 ENCOUNTER — Encounter (HOSPITAL_BASED_OUTPATIENT_CLINIC_OR_DEPARTMENT_OTHER): Payer: Self-pay | Admitting: Emergency Medicine

## 2017-11-22 DIAGNOSIS — F17210 Nicotine dependence, cigarettes, uncomplicated: Secondary | ICD-10-CM | POA: Insufficient documentation

## 2017-11-22 DIAGNOSIS — Y999 Unspecified external cause status: Secondary | ICD-10-CM | POA: Insufficient documentation

## 2017-11-22 DIAGNOSIS — X509XXA Other and unspecified overexertion or strenuous movements or postures, initial encounter: Secondary | ICD-10-CM | POA: Insufficient documentation

## 2017-11-22 DIAGNOSIS — Y929 Unspecified place or not applicable: Secondary | ICD-10-CM | POA: Insufficient documentation

## 2017-11-22 DIAGNOSIS — G8929 Other chronic pain: Secondary | ICD-10-CM | POA: Insufficient documentation

## 2017-11-22 DIAGNOSIS — Y939 Activity, unspecified: Secondary | ICD-10-CM | POA: Insufficient documentation

## 2017-11-22 DIAGNOSIS — J45909 Unspecified asthma, uncomplicated: Secondary | ICD-10-CM | POA: Insufficient documentation

## 2017-11-22 DIAGNOSIS — M25562 Pain in left knee: Secondary | ICD-10-CM | POA: Insufficient documentation

## 2017-11-22 DIAGNOSIS — M25471 Effusion, right ankle: Secondary | ICD-10-CM | POA: Insufficient documentation

## 2017-11-22 DIAGNOSIS — I10 Essential (primary) hypertension: Secondary | ICD-10-CM | POA: Insufficient documentation

## 2017-11-22 MED ORDER — AMLODIPINE BESYLATE 5 MG PO TABS: 5.0000 mg | ORAL_TABLET | Freq: Every day | ORAL | 0 refills | Status: AC

## 2017-11-22 MED ORDER — NAPROXEN 500 MG PO TABS: 500.0000 mg | ORAL_TABLET | Freq: Two times a day (BID) | ORAL | 0 refills | Status: DC

## 2017-11-22 NOTE — ED Triage Notes (Signed)
R knee and foot pain x 3 months. Denies injury.

## 2017-11-22 NOTE — Discharge Instructions (Signed)
Medications: Naprosyn  Treatment: Take Naprosyn twice daily as needed for your pain.  Use ice 3-4 times daily alternating 20 minutes on, 20 minutes off.  Follow-up: Please follow-up with Dr. Pearletha ForgeHudnall for further evaluation and treatment of your symptoms.  Please return to the emergency department if you develop any new or worsening symptoms.  Please follow-up and establish care with a primary care provider for further management and treatment of your blood pressure and your general health.

## 2017-11-22 NOTE — ED Provider Notes (Signed)
[facility] EMERGENCY DEPARTMENT Provider Note   CSN: [id number] Arrival date & time: 11/22/17  1031     History   Chief Complaint Chief Complaint  Patient presents with  . Knee Pain  . Foot Pain    HPI Alexandra Murphy is a 42 y.o. female with history of hypertension (untreated), asthma who presents with right ankle pain and left knee pain for the past 3 months.  Patient reports she has had pain in her left knee in the past, however, better for about a year after she got a cortisone injection.  She works 12 hours a day on her feet.  Her knee pain is only present when she walks.  She occasionally has some tingling in her right foot as well as low back pain, which she has neither today.  She has not tried any medication at home for her symptoms.  She does not currently have a primary care provider.  She denies any chest pain, shortness of breath, abdominal pain, nausea, vomiting.  HPI  Past Medical History:  Diagnosis Date  . Abnormal Pap smear 10/15/2012   ascus  . Asthma   . Headache(784.0)   . Hypertension   . Varicose veins     Patient Active Problem List   Diagnosis Date Noted  . Disruption of internal operation (surgical) wound 03/08/2013  . Anemia 03/08/2013  . S/P cesarean section 02/25/2013  . S/P tubal ligation 02/25/2013  . Short cervix affecting pregnancy with delivery 02/22/2013  . Supervision of high-risk pregnancy 02/22/2013  . Chronic hypertension complicating or reason for care during pregnancy 01/29/2013    Past Surgical History:  Procedure Laterality Date  . CESAREAN SECTION WITH BILATERAL TUBAL LIGATION N/A 02/23/2013   Procedure: CESAREAN SECTION WITH BILATERAL TUBAL LIGATION;  Surgeon: Tereso Newcomer, MD;  Location: [facility];  Service: Obstetrics;  Laterality: N/A;  Primary Cesarean Section Delivery Baby Boy @ 1631, Apgars   . GANGLION CYST EXCISION      OB History    Gravida Para Term Preterm AB Living   5 3 2 1 2 3    SAB TAB Ectopic  Multiple Live Births   2 0 0 0 3       Home Medications    Prior to Admission medications   Medication Sig Start Date End Date Taking? Authorizing Provider  albuterol (PROVENTIL HFA;VENTOLIN HFA) 108 (90 BASE) MCG/ACT inhaler Inhale 2 puffs into the lungs every 6 (six) hours as needed for wheezing or shortness of breath.    [provider]  albuterol (PROVENTIL) (5 MG/ML) 0.5% nebulizer solution Take 0.5 mLs (2.5 mg total) by nebulization every 6 (six) hours as needed for wheezing or shortness of breath. 04/03/17   Mackuen, Courteney Lyn, MD  amLODipine (NORVASC) 5 MG tablet Take 1 tablet (5 mg total) by mouth daily. 11/22/17   Kately Graffam, Waylan Boga, PA-C  budesonide-formoterol (SYMBICORT) 80-4.5 MCG/ACT inhaler Inhale 2 puffs into the lungs daily. 04/03/17   Mackuen, Courteney Lyn, MD  naproxen (NAPROSYN) 500 MG tablet Take 1 tablet (500 mg total) by mouth 2 (two) times daily. 11/22/17   Emi Holes, PA-C    Family History Family History  Problem Relation Age of Onset  . Hypertension Mother   . Hypertension Maternal Grandmother     Social History Social History   Tobacco Use  . Smoking status: Current Some Day Smoker    Packs/day: 0.25    Types: Cigarettes  . Smokeless tobacco: Never Used  Substance Use  Topics  . Alcohol use: Yes    Comment: occasional  . Drug use: No     Allergies   Patient has no known allergies.   Review of Systems Review of Systems  Constitutional: Negative for fever.  Eyes: Negative for visual disturbance.  Respiratory: Negative for shortness of breath.   Cardiovascular: Negative for chest pain.  Gastrointestinal: Negative for abdominal pain, nausea and vomiting.  Musculoskeletal: Positive for arthralgias and back pain (intermittent).  Neurological: Negative for numbness.     Physical Exam Updated Vital Signs BP (!) 157/107 (BP Location: Right Arm)   Pulse 76   Temp 97.8 F (36.6 C) (Oral)   Resp 18   Ht 5\' 7"  (1.702 m)   Wt  104.3 kg (230 lb)   LMP 11/17/2017   SpO2 100%   BMI 36.02 kg/m   Physical Exam  Constitutional: She appears well-developed and well-nourished. No distress.  HENT:  Head: Normocephalic and atraumatic.  Mouth/Throat: Oropharynx is clear and moist. No oropharyngeal exudate.  Eyes: Conjunctivae are normal. Pupils are equal, round, and reactive to light. Right eye exhibits no discharge. Left eye exhibits no discharge. No scleral icterus.  Neck: Normal range of motion. Neck supple. No thyromegaly present.  Cardiovascular: Normal rate, regular rhythm, normal heart sounds and intact distal pulses. Exam reveals no gallop and no friction rub.  No murmur heard. Pulmonary/Chest: Effort normal and breath sounds normal. No stridor. No respiratory distress. She has no wheezes. She has no rales.  Abdominal: Soft. Bowel sounds are normal. She exhibits no distension. There is no tenderness. There is no rebound and no guarding.  Musculoskeletal: She exhibits no edema.  L knee: No bony tenderness, negative anterior/posterior drawer, no laxity or pain with varus and valgus stress, negative McMurray's R ankle: Edema over and posterior to lateral malleolus with overlying varicose veins, tenderness to this area, Achilles tendon intact, dorsiflexion and plantarflexion intact, sensation intact, DP pulse intact No midline cervical, thoracic, or lumbar tenderness no tenderness on palpation of the back  Lymphadenopathy:    She has no cervical adenopathy.  Neurological: She is alert. Coordination normal.  Skin: Skin is warm and dry. No rash noted. She is not diaphoretic. No pallor.  Psychiatric: She has a normal mood and affect.  Nursing note and vitals reviewed.    ED Treatments / Results  Labs (all labs ordered are listed, but only abnormal results are displayed) Labs Reviewed - No data to display  EKG  EKG Interpretation None       Radiology Dg Ankle Complete Right  Result Date: 11/22/2017 CLINICAL  DATA:  Right ankle pain and swelling for 3 months. Heel pain. EXAM: RIGHT ANKLE - COMPLETE 3+ VIEW COMPARISON:  None. FINDINGS: There is no evidence of fracture, dislocation, or joint effusion. There is no evidence of arthropathy or other focal bone abnormality. Soft tissues are unremarkable. IMPRESSION: Negative. Electronically Signed   By: Sebastian Ache M.D.   On: 11/22/2017 11:25    Procedures Procedures (including critical care time)  Medications Ordered in ED Medications - No data to display   Initial Impression / Assessment and Plan / ED Course  I have reviewed the triage vital signs and the nursing notes.  Pertinent labs & imaging results that were available during my care of the patient were reviewed by me and considered in my medical decision making (see chart for details).     Patient with acute on chronic left knee pain.  No tenderness on palpation.  No warmth or erythema.  Right ankle x-ray negative.  Patient does have edema behind lateral malleolus.  No warmth or concern for septic joint.  Suspect patient's symptoms are related to patient's job and standing on her feet for 12 hours.  Patient advised to use ice, NSAIDs, and rest.  I also gave patient ASO splint to wear at work.  Patient to follow-up with sports medicine for further evaluation and treatment.  Patient will also be restarted on amlodipine for her blood pressure.  She is advised to follow-up and establish care with PCP for further prescribing of this.  Return precautions discussed.  Patient stable and discharged in satisfactory condition.  Final Clinical Impressions(s) / ED Diagnoses   Final diagnoses:  Chronic pain of left knee  Right ankle swelling    ED Discharge Orders        Ordered    naproxen (NAPROSYN) 500 MG tablet  2 times daily     11/22/17 1206    amLODipine (NORVASC) 5 MG tablet  Daily     11/22/17 [address]13       Soul Deveney M, [place] JerseyPA-C 11/23/17 2034    Vanetta MuldersZackowski, Scott, MD 11/26/17 1538

## 2017-12-02 ENCOUNTER — Emergency Department (HOSPITAL_BASED_OUTPATIENT_CLINIC_OR_DEPARTMENT_OTHER)
Admission: EM | Admit: 2017-12-02 | Discharge: 2017-12-03 | Disposition: A | Discharge status: Home / Self Care | Payer: Self-pay | Attending: Emergency Medicine | Admitting: Emergency Medicine

## 2017-12-02 ENCOUNTER — Encounter (HOSPITAL_BASED_OUTPATIENT_CLINIC_OR_DEPARTMENT_OTHER): Payer: Self-pay

## 2017-12-02 ENCOUNTER — Other Ambulatory Visit: Payer: Self-pay

## 2017-12-02 DIAGNOSIS — B9789 Other viral agents as the cause of diseases classified elsewhere: Secondary | ICD-10-CM | POA: Insufficient documentation

## 2017-12-02 DIAGNOSIS — F17210 Nicotine dependence, cigarettes, uncomplicated: Secondary | ICD-10-CM | POA: Insufficient documentation

## 2017-12-02 DIAGNOSIS — Z79899 Other long term (current) drug therapy: Secondary | ICD-10-CM | POA: Insufficient documentation

## 2017-12-02 DIAGNOSIS — J069 Acute upper respiratory infection, unspecified: Secondary | ICD-10-CM | POA: Insufficient documentation

## 2017-12-02 DIAGNOSIS — I10 Essential (primary) hypertension: Secondary | ICD-10-CM | POA: Insufficient documentation

## 2017-12-02 DIAGNOSIS — J45909 Unspecified asthma, uncomplicated: Secondary | ICD-10-CM | POA: Insufficient documentation

## 2017-12-02 MED ORDER — IBUPROFEN 800 MG PO TABS
800.0000 mg | ORAL_TABLET | Freq: Once | ORAL | Status: AC
  Administered 2017-12-02: 800 mg via ORAL
  Filled 2017-12-02: qty 1

## 2017-12-02 MED ORDER — ALBUTEROL SULFATE (2.5 MG/3ML) 0.083% IN NEBU
INHALATION_SOLUTION | RESPIRATORY_TRACT | Status: AC
  Administered 2017-12-02: 2.5 mg
  Filled 2017-12-02: qty 3

## 2017-12-02 MED ORDER — IPRATROPIUM-ALBUTEROL 0.5-2.5 (3) MG/3ML IN SOLN
RESPIRATORY_TRACT | Status: AC
  Administered 2017-12-02: 3 mL
  Filled 2017-12-02: qty 3

## 2017-12-02 NOTE — ED Triage Notes (Addendum)
C/o flu like sx day 3-NAD-steady gait-last albuterol neb 330pm-last aleve 615pm-NAD-steady gait

## 2017-12-03 MED ORDER — ALBUTEROL SULFATE HFA 108 (90 BASE) MCG/ACT IN AERS
2.0000 | INHALATION_SPRAY | RESPIRATORY_TRACT | Status: DC | PRN
  Administered 2017-12-03: 2 via RESPIRATORY_TRACT
  Filled 2017-12-03: qty 6.7

## 2017-12-03 MED ORDER — PROMETHAZINE-CODEINE 6.25-10 MG/5ML PO SYRP: 5.0000 mL | ORAL_SOLUTION | ORAL | 0 refills | Status: DC | PRN

## 2017-12-03 NOTE — ED Notes (Signed)
Pt came out of her room and asked if she could have some cough syrup.  Pt informed that nurses cannot order cough medication in their protocol orders, and that she needs to see a doctor.  Incidentally, nurse and RT staff have not heard the patient cough once since she was put in the room, which is next to the nurse's station, over an hour ago.

## 2017-12-03 NOTE — ED Provider Notes (Signed)
[facility] Provider Note: Alexandra Dell, MD, FACEP  CSN: [id number] MRN: [id number] ARRIVAL: 12/02/17 at 1931 ROOM: MH09/MH09   CHIEF COMPLAINT  Cough   HISTORY OF PRESENT ILLNESS  12/03/17 12:29 AM Alexandra Murphy is a 42 y.o. female with a history of asthma.  She is here with a 3-day history of flulike symptoms.  Specifically she has had low-grade fever, body aches, nasal congestion, cough, wheezing and shortness of breath.  She also has sore throat which she attributes to coughing.  She rates her body aches as a 9 out of 10, located primarily in her left shoulder and back.  She was given an albuterol and Atrovent neb treatment prior to my evaluation with resolution of wheezing which she had present on arrival.  She is requesting an albuterol inhaler and cough medicine.   Past Medical History:  Diagnosis Date  . Abnormal Pap smear 10/15/2012   ascus  . Asthma   . Headache(784.0)   . Hypertension   . Varicose veins     Past Surgical History:  Procedure Laterality Date  . CESAREAN SECTION WITH BILATERAL TUBAL LIGATION N/A 02/23/2013   Procedure: CESAREAN SECTION WITH BILATERAL TUBAL LIGATION;  Surgeon: Tereso Newcomer, MD;  Location: [facility];  Service: Obstetrics;  Laterality: N/A;  Primary Cesarean Section Delivery Baby Boy @ 1631, Apgars   . GANGLION CYST EXCISION      Family History  Problem Relation Age of Onset  . Hypertension Mother   . Hypertension Maternal Grandmother     Social History   Tobacco Use  . Smoking status: Current Some Day Smoker    Packs/day: 0.25    Types: Cigarettes  . Smokeless tobacco: Never Used  Substance Use Topics  . Alcohol use: Yes    Comment: occasional  . Drug use: No    Prior to Admission medications   Medication Sig Start Date End Date Taking? Authorizing Provider  albuterol (PROVENTIL HFA;VENTOLIN HFA) 108 (90 BASE) MCG/ACT inhaler Inhale 2 puffs into the lungs every 6 (six) hours as needed for wheezing or  shortness of breath.    [provider]  albuterol (PROVENTIL) (5 MG/ML) 0.5% nebulizer solution Take 0.5 mLs (2.5 mg total) by nebulization every 6 (six) hours as needed for wheezing or shortness of breath. 04/03/17   Mackuen, Courteney Lyn, MD  amLODipine (NORVASC) 5 MG tablet Take 1 tablet (5 mg total) by mouth daily. 11/22/17   Law, Waylan Boga, PA-C  budesonide-formoterol (SYMBICORT) 80-4.5 MCG/ACT inhaler Inhale 2 puffs into the lungs daily. 04/03/17   Mackuen, Courteney Lyn, MD  naproxen (NAPROSYN) 500 MG tablet Take 1 tablet (500 mg total) by mouth 2 (two) times daily. 11/22/17   Emi Holes, PA-C    Allergies Patient has no known allergies.   REVIEW OF SYSTEMS  Negative except as noted here or in the History of Present Illness.   PHYSICAL EXAMINATION  Initial Vital Signs Blood pressure (!) 152/76, pulse 85, temperature 98.8 F (37.1 C), temperature source Oral, resp. rate 16, height 5\' 7"  (1.702 m), weight 117 kg (258 lb), last menstrual period 11/17/2017, SpO2 95 %.  Examination General: Well-developed, well-nourished female in no acute distress; appearance consistent with age of record HENT: normocephalic; atraumatic; nasal congestion; no pharyngeal erythema or exudate Eyes: pupils equal, round and reactive to light; extraocular muscles intact Neck: supple Heart: regular rate and rhythm Lungs: Faint coarse sounds in bases; coughed one time after deep inspiration Abdomen: soft; nondistended; nontender; bowel sounds  present Extremities: No deformity; full range of motion Neurologic: Awake, alert and oriented; motor function intact in all extremities and symmetric; no facial droop Skin: Warm and dry Psychiatric: Flat affect   RESULTS  Summary of this visit's results, reviewed by myself:   EKG Interpretation  Date/Time:    Ventricular Rate:    PR Interval:    QRS Duration:   QT Interval:    QTC Calculation:   R Axis:     Text Interpretation:         Laboratory Studies: No results found for this or any previous visit (from the past 24 hour(s)). Imaging Studies: No results found.  ED COURSE  Nursing notes and initial vitals signs, including pulse oximetry, reviewed.  Vitals:   12/02/17 1945 12/02/17 1946 12/02/17 2028 12/02/17 2317  BP: (!) 180/106   (!) 152/76  Pulse: 73   85  Resp: 20   16  Temp: 100.2 F (37.9 C)   98.8 F (37.1 C)  TempSrc: Oral   Oral  SpO2: 98%  97% 95%  Weight:  117 kg (258 lb)    Height:  5\' 7"  (1.702 m)     Patient given albuterol inhaler and AeroChamber and instructed in their use.  PROCEDURES    ED DIAGNOSES     ICD-10-CM   1. Viral URI with cough J06.9    B97.89        Yaeko Fazekas, MD 12/03/17 0041

## 2017-12-13 ENCOUNTER — Emergency Department (HOSPITAL_BASED_OUTPATIENT_CLINIC_OR_DEPARTMENT_OTHER)
Admission: EM | Admit: 2017-12-13 | Discharge: 2017-12-14 | Disposition: A | Discharge status: Home / Self Care | Payer: Self-pay | Attending: Emergency Medicine | Admitting: Emergency Medicine

## 2017-12-13 ENCOUNTER — Encounter (HOSPITAL_BASED_OUTPATIENT_CLINIC_OR_DEPARTMENT_OTHER): Payer: Self-pay | Admitting: *Deleted

## 2017-12-13 ENCOUNTER — Other Ambulatory Visit: Payer: Self-pay

## 2017-12-13 DIAGNOSIS — J45909 Unspecified asthma, uncomplicated: Secondary | ICD-10-CM | POA: Insufficient documentation

## 2017-12-13 DIAGNOSIS — T7840XA Allergy, unspecified, initial encounter: Secondary | ICD-10-CM | POA: Insufficient documentation

## 2017-12-13 DIAGNOSIS — F17210 Nicotine dependence, cigarettes, uncomplicated: Secondary | ICD-10-CM | POA: Insufficient documentation

## 2017-12-13 DIAGNOSIS — I10 Essential (primary) hypertension: Secondary | ICD-10-CM | POA: Insufficient documentation

## 2017-12-13 DIAGNOSIS — Z79899 Other long term (current) drug therapy: Secondary | ICD-10-CM | POA: Insufficient documentation

## 2017-12-13 MED ORDER — CETIRIZINE HCL 10 MG PO TABS: 10.0000 mg | ORAL_TABLET | Freq: Every day | ORAL | 0 refills | Status: DC

## 2017-12-13 MED ORDER — DIPHENHYDRAMINE HCL 50 MG/ML IJ SOLN
25.0000 mg | Freq: Once | INTRAMUSCULAR | Status: AC
  Administered 2017-12-13: 25 mg via INTRAVENOUS
  Filled 2017-12-13: qty 1

## 2017-12-13 MED ORDER — METHYLPREDNISOLONE SODIUM SUCC 125 MG IJ SOLR
125.0000 mg | Freq: Once | INTRAMUSCULAR | Status: AC
  Administered 2017-12-13: 125 mg via INTRAVENOUS
  Filled 2017-12-13: qty 2

## 2017-12-13 MED ORDER — PREDNISONE 20 MG PO TABS: 20.0000 mg | ORAL_TABLET | Freq: Two times a day (BID) | ORAL | 0 refills | Status: DC

## 2017-12-13 MED ORDER — ONDANSETRON HCL 4 MG/2ML IJ SOLN
4.0000 mg | Freq: Once | INTRAMUSCULAR | Status: AC
  Administered 2017-12-13: 4 mg via INTRAVENOUS
  Filled 2017-12-13: qty 2

## 2017-12-13 MED ORDER — FAMOTIDINE IN NACL 20-0.9 MG/50ML-% IV SOLN
20.0000 mg | Freq: Once | INTRAVENOUS | Status: AC
  Administered 2017-12-13: 20 mg via INTRAVENOUS
  Filled 2017-12-13: qty 50

## 2017-12-13 MED ORDER — EPINEPHRINE PF 1 MG/ML IJ SOLN: 0.3000 mg | Freq: Once | INTRAMUSCULAR | Status: AC

## 2017-12-13 MED ORDER — EPINEPHRINE 0.3 MG/0.3ML IJ SOAJ
INTRAMUSCULAR | Status: AC
  Administered 2017-12-13: 0.3 mg
  Filled 2017-12-13: qty 0.3

## 2017-12-13 MED ORDER — ALBUTEROL SULFATE (2.5 MG/3ML) 0.083% IN NEBU
2.5000 mg | INHALATION_SOLUTION | Freq: Once | RESPIRATORY_TRACT | Status: AC
  Administered 2017-12-13: 2.5 mg via RESPIRATORY_TRACT
  Filled 2017-12-13: qty 3

## 2017-12-13 NOTE — ED Notes (Signed)
Pt given gingerale and crackers per EDP instruction and pt request.

## 2017-12-13 NOTE — ED Triage Notes (Signed)
Pt reports she ate something this afternoon that had shrimp in it unbeknownst to her. She has a shellfish allergy. Reports she has vomited 3 times and she is itching all over and her throat feels itchy and tight

## 2017-12-13 NOTE — Discharge Instructions (Signed)
Zyrtec daily until 24 hours without itching. Continue prednisone for the next 48 hours.

## 2017-12-13 NOTE — ED Notes (Signed)
RN to room to DC patient. Pt very sleepy, fell asleep while talking with RN. Pt states she has no one who could come pick her up. DC on hold for now due to drowsiness, EDP aware.

## 2017-12-13 NOTE — ED Notes (Signed)
Pt attempting to call family member for a ride. Given meal and gingerale. Pt sitting up eating.

## 2017-12-13 NOTE — ED Provider Notes (Signed)
[facility] EMERGENCY DEPARTMENT Provider Note   CSN: [id number] Arrival date & time: 12/13/17  5621     History   Chief Complaint Chief Complaint  Patient presents with  . Allergic Reaction    HPI Alexandra Murphy is a 42 y.o. female.  Chief complaint is possible allergic reaction  HPI: This is a 42 year old female.  She presents here after eating some quesadilla that she did not realize had shrimp in it.  She feels she is having allergic reaction.  She started feeling scratching and tightness in her throat.  She had nausea and episodes of vomiting.  Itching but no visible hives.  Felt she was wheezing.  No urticaria.  No syncope.  Past Medical History:  Diagnosis Date  . Abnormal Pap smear 10/15/2012   ascus  . Asthma   . Headache(784.0)   . Hypertension   . Varicose veins     Patient Active Problem List   Diagnosis Date Noted  . Disruption of internal operation (surgical) wound 03/08/2013  . Anemia 03/08/2013  . S/P cesarean section 02/25/2013  . S/P tubal ligation 02/25/2013  . Short cervix affecting pregnancy with delivery 02/22/2013  . Supervision of high-risk pregnancy 02/22/2013  . Chronic hypertension complicating or reason for care during pregnancy 01/29/2013    Past Surgical History:  Procedure Laterality Date  . CESAREAN SECTION WITH BILATERAL TUBAL LIGATION N/A 02/23/2013   Procedure: CESAREAN SECTION WITH BILATERAL TUBAL LIGATION;  Surgeon: Tereso Newcomer, MD;  Location: [facility];  Service: Obstetrics;  Laterality: N/A;  Primary Cesarean Section Delivery Baby Boy @ 1631, Apgars   . GANGLION CYST EXCISION       OB History    Gravida  5   Para  3   Term  2   Preterm  1   AB  2   Living  3     SAB  2   TAB  0   Ectopic  0   Multiple  0   Live Births  3            Home Medications    Prior to Admission medications   Medication Sig Start Date End Date Taking? Authorizing Provider  albuterol (PROVENTIL HFA;VENTOLIN  HFA) 108 (90 BASE) MCG/ACT inhaler Inhale 2 puffs into the lungs every 6 (six) hours as needed for wheezing or shortness of breath.    [provider]  albuterol (PROVENTIL) (5 MG/ML) 0.5% nebulizer solution Take 0.5 mLs (2.5 mg total) by nebulization every 6 (six) hours as needed for wheezing or shortness of breath. 04/03/17   Mackuen, Courteney Lyn, MD  amLODipine (NORVASC) 5 MG tablet Take 1 tablet (5 mg total) by mouth daily. 11/22/17   Law, Waylan Boga, PA-C  budesonide-formoterol (SYMBICORT) 80-4.5 MCG/ACT inhaler Inhale 2 puffs into the lungs daily. 04/03/17   Mackuen, Courteney Lyn, MD  cetirizine (ZYRTEC) 10 MG tablet Take 1 tablet (10 mg total) by mouth daily. 1 po q day prn allergies 12/13/17   Rolland Porter, MD  naproxen (NAPROSYN) 500 MG tablet Take 1 tablet (500 mg total) by mouth 2 (two) times daily. 11/22/17   Law, Waylan Boga, PA-C  predniSONE (DELTASONE) 20 MG tablet Take 1 tablet (20 mg total) by mouth 2 (two) times daily with a meal. 12/13/17   Rolland Porter, MD  promethazine-codeine [facility] WITH CODEINE) 6.25-10 MG/5ML syrup Take 5 mLs by mouth every 4 (four) hours as needed for cough. 12/03/17   Molpus, Jonny Ruiz, MD  Family History Family History  Problem Relation Age of Onset  . Hypertension Mother   . Hypertension Maternal Grandmother     Social History Social History   Tobacco Use  . Smoking status: Current Some Day Smoker    Packs/day: 0.25    Types: Cigarettes  . Smokeless tobacco: Never Used  Substance Use Topics  . Alcohol use: Yes    Comment: occasional  . Drug use: No     Allergies   Shrimp [shellfish allergy]   Review of Systems Review of Systems  Constitutional: Negative for appetite change, chills, diaphoresis, fatigue and fever.  HENT: Negative for mouth sores, sore throat and trouble swallowing.        Throat "tightness".  Eyes: Negative for visual disturbance.  Respiratory: Positive for wheezing. Negative for cough, chest tightness and  shortness of breath.   Cardiovascular: Negative for chest pain.  Gastrointestinal: Positive for nausea. Negative for abdominal distention, abdominal pain, diarrhea and vomiting.  Endocrine: Negative for polydipsia, polyphagia and polyuria.  Genitourinary: Negative for dysuria, frequency and hematuria.  Musculoskeletal: Negative for gait problem.  Skin: Negative for color change, pallor and rash.  Neurological: Negative for dizziness, syncope, light-headedness and headaches.  Hematological: Does not bruise/bleed easily.  Psychiatric/Behavioral: Negative for behavioral problems and confusion.     Physical Exam Updated Vital Signs BP (!) 152/81   Pulse 78   Temp 98.1 F (36.7 C) (Oral)   Resp 16   Ht 5\' 7"  (1.702 m)   Wt 117 kg (258 lb)   LMP 11/17/2017   SpO2 90%   BMI 40.41 kg/m   Physical Exam  Constitutional: She is oriented to person, place, and time. She appears well-developed and well-nourished. No distress.  HENT:  Head: Normocephalic.  Posterior pharynx benign.  No uvular edema or deviation.  No stridor or drooling.  Eyes: Pupils are equal, round, and reactive to light. Conjunctivae are normal. No scleral icterus.  Neck: Normal range of motion. Neck supple. No thyromegaly present.  Cardiovascular: Normal rate and regular rhythm. Exam reveals no gallop and no friction rub.  No murmur heard. She is not hypotensive, or tachycardic.  Pulmonary/Chest: Effort normal and breath sounds normal. No respiratory distress. She has no wheezes. She has no rales.  Wheezing.  No prolongation or increased work of breathing.  Abdominal: Soft. Bowel sounds are normal. She exhibits no distension. There is no tenderness. There is no rebound.  Musculoskeletal: Normal range of motion.  Neurological: She is alert and oriented to person, place, and time.  Skin: Skin is warm and dry. No rash noted.  Recent scratching at her skin but no visible urticaria or rash or other acute abnormalities  noted.  Psychiatric: She has a normal mood and affect. Her behavior is normal.     ED Treatments / Results  Labs (all labs ordered are listed, but only abnormal results are displayed) Labs Reviewed - No data to display  EKG None  Radiology No results found.  Procedures Procedures (including critical care time)  Medications Ordered in ED Medications  EPINEPHrine (ADRENALIN) 0.3 mg (0 mg Subcutaneous [facility] 2020)  methylPREDNISolone sodium succinate (SOLU-MEDROL) 125 mg/2 mL injection 125 mg (125 mg Intravenous Given 12/13/17 2019)  diphenhydrAMINE (BENADRYL) injection 25 mg (25 mg Intravenous Given 12/13/17 2019)  famotidine (PEPCID) IVPB 20 mg premix (0 mg Intravenous Stopped 12/13/17 2027)  ondansetron (ZOFRAN) injection 4 mg (4 mg Intravenous Given 12/13/17 2019)  albuterol (PROVENTIL) (2.5 MG/3ML) 0.083% nebulizer solution 2.5  mg (2.5 mg Nebulization Given 12/13/17 2021)  EPINEPHrine (EPI-PEN) 0.3 mg/0.3 mL injection (0.3 mg  Given 12/13/17 2019)     Initial Impression / Assessment and Plan / ED Course  I have reviewed the triage vital signs and the nursing notes.  Pertinent labs & imaging results that were available during my care of the patient were reviewed by me and considered in my medical decision making (see chart for details).   Likely allergic reaction.  She is symptomatic with dermatologic and GI as well as respiratory symptoms.  Not frankly anaphylactic with abnormal vital signs.  Was given nebulized albuterol.  Given subcu epi 0.3.  IVs placed.  Given Benadryl 25 IV, Pepcid 20 IV, Solu-Medrol 125 IV, Zofran 4 IV.  Observe for 2 hours and reexamined x2.  Had continued improvement.  Taking p.o.  Clear lungs.  Normal pharynx.  No wheezing.  Stable vitals.  She is appropriate for home.  Will discharge with antihistamines and steroids for 48 hours.  Recheck a recurrence.  Final Clinical Impressions(s) / ED Diagnoses   Final diagnoses:  Allergic  reaction, initial encounter    ED Discharge Orders        Ordered    cetirizine (ZYRTEC) 10 MG tablet  Daily     12/13/17 2227    predniSONE (DELTASONE) 20 MG tablet  2 times daily with meals     12/13/17 2227       Rolland Porter, MD 12/13/17 2227

## 2017-12-14 NOTE — ED Notes (Signed)
Pt has been awake for the past hour, and has eaten. Pt requesting to go home at this time.

## 2020-05-13 ENCOUNTER — Other Ambulatory Visit: Payer: Self-pay

## 2020-05-13 ENCOUNTER — Emergency Department (HOSPITAL_BASED_OUTPATIENT_CLINIC_OR_DEPARTMENT_OTHER)
Admission: EM | Admit: 2020-05-13 | Discharge: 2020-05-13 | Disposition: A | Discharge status: Home / Self Care | Payer: Medicaid Other | Attending: Emergency Medicine | Admitting: Emergency Medicine

## 2020-05-13 ENCOUNTER — Encounter (HOSPITAL_BASED_OUTPATIENT_CLINIC_OR_DEPARTMENT_OTHER): Payer: Self-pay | Admitting: Emergency Medicine

## 2020-05-13 DIAGNOSIS — U071 COVID-19: Secondary | ICD-10-CM | POA: Diagnosis not present

## 2020-05-13 DIAGNOSIS — Z5321 Procedure and treatment not carried out due to patient leaving prior to being seen by health care provider: Secondary | ICD-10-CM | POA: Diagnosis not present

## 2020-05-13 DIAGNOSIS — R519 Headache, unspecified: Secondary | ICD-10-CM | POA: Diagnosis present

## 2020-05-13 LAB — SARS CORONAVIRUS 2 BY RT PCR (HOSPITAL ORDER, PERFORMED IN ~~LOC~~ HOSPITAL LAB): SARS Coronavirus 2: POSITIVE — AB

## 2020-05-13 NOTE — ED Triage Notes (Signed)
Headache, chills, loss of taste and smell since Monday. Also states she pulled a muscle her back.

## 2020-10-16 ENCOUNTER — Emergency Department (HOSPITAL_BASED_OUTPATIENT_CLINIC_OR_DEPARTMENT_OTHER)
Admission: EM | Admit: 2020-10-16 | Discharge: 2020-10-17 | Disposition: A | Discharge status: Home / Self Care | Payer: Medicaid Other | Attending: Emergency Medicine | Admitting: Emergency Medicine

## 2020-10-16 ENCOUNTER — Other Ambulatory Visit: Payer: Self-pay

## 2020-10-16 ENCOUNTER — Emergency Department (HOSPITAL_BASED_OUTPATIENT_CLINIC_OR_DEPARTMENT_OTHER): Payer: Medicaid Other

## 2020-10-16 ENCOUNTER — Encounter (HOSPITAL_BASED_OUTPATIENT_CLINIC_OR_DEPARTMENT_OTHER): Payer: Self-pay

## 2020-10-16 DIAGNOSIS — J45909 Unspecified asthma, uncomplicated: Secondary | ICD-10-CM | POA: Diagnosis not present

## 2020-10-16 DIAGNOSIS — Z79899 Other long term (current) drug therapy: Secondary | ICD-10-CM | POA: Insufficient documentation

## 2020-10-16 DIAGNOSIS — R0789 Other chest pain: Secondary | ICD-10-CM | POA: Insufficient documentation

## 2020-10-16 DIAGNOSIS — Z7951 Long term (current) use of inhaled steroids: Secondary | ICD-10-CM | POA: Insufficient documentation

## 2020-10-16 DIAGNOSIS — I10 Essential (primary) hypertension: Secondary | ICD-10-CM | POA: Insufficient documentation

## 2020-10-16 DIAGNOSIS — Z87891 Personal history of nicotine dependence: Secondary | ICD-10-CM | POA: Insufficient documentation

## 2020-10-16 LAB — BASIC METABOLIC PANEL
Anion gap: 9 (ref 5–15)
BUN: 23 mg/dL — ABNORMAL HIGH (ref 6–20)
CO2: 27 mmol/L (ref 22–32)
Calcium: 8.5 mg/dL — ABNORMAL LOW (ref 8.9–10.3)
Chloride: 101 mmol/L (ref 98–111)
Creatinine, Ser: 1.06 mg/dL — ABNORMAL HIGH (ref 0.44–1.00)
GFR, Estimated: >60 mL/min (ref >60)
Glucose, Bld: 88 mg/dL (ref 70–99)
Potassium: 3.4 mmol/L — ABNORMAL LOW (ref 3.5–5.1)
Sodium: 137 mmol/L (ref 135–145)

## 2020-10-16 LAB — CBC
HCT: 36.7 % (ref 36.0–46.0)
Hemoglobin: 12.4 g/dL (ref 12.0–15.0)
MCH: 28.6 pg (ref 26.0–34.0)
MCHC: 33.8 g/dL (ref 30.0–36.0)
MCV: 84.8 fL (ref 80.0–100.0)
Platelets: 285 10*3/uL (ref 150–400)
RBC: 4.33 MIL/uL (ref 3.87–5.11)
RDW: 15.2 % (ref 11.5–15.5)
WBC: 6.6 10*3/uL (ref 4.0–10.5)
nRBC: 0 % (ref 0.0–0.2)

## 2020-10-16 LAB — TROPONIN I (HIGH SENSITIVITY)
Troponin I (High Sensitivity): 4 ng/L (ref <18)
Troponin I (High Sensitivity): 4 ng/L (ref <18)

## 2020-10-16 MED ORDER — LIDOCAINE VISCOUS HCL 2 % MT SOLN
15.0000 mL | Freq: Once | OROMUCOSAL | Status: AC
  Administered 2020-10-16: 15 mL via ORAL
  Filled 2020-10-16: qty 15

## 2020-10-16 MED ORDER — ALUM & MAG HYDROXIDE-SIMETH 200-200-20 MG/5ML PO SUSP
30.0000 mL | Freq: Once | ORAL | Status: AC
  Administered 2020-10-16: 30 mL via ORAL
  Filled 2020-10-16: qty 30

## 2020-10-16 NOTE — ED Provider Notes (Signed)
[facility] EMERGENCY DEPARTMENT Provider Note   CSN: [id number] Arrival date & time: 10/16/20  1801     History Chief Complaint  Patient presents with  . Chest Pain    Alexandra Murphy is a 45 y.o. female with past medical history significant for asthma, hypertension.  HPI Patient presents to emergency room today with chief complaint of x 4 months of intermittent chest pain. She states the pain typically occurs in the right side of her chest and feels like a sharp and stabbing sensation. She states the pain will occasionally radiate. She states she mostly notices the pain when she is lying in bed at night. She denies the pain being brought on or worse with exertion. She denies any associated shortness of breath. She has not had any medications for symptoms prior to arrival. She last had the chest pain last night at 5 PM. She rates the pain when present 8 out of 10 in severity. Pain does not radiate to her back. She has a family history of cardiac disease with her younger brother having two MIs, his first was at age 40. Patient quit smoking x1 year ago. Denies any drug use and rarely drinks alcohol.  Denies dyspnea on exertion, SOB, chest tightness or pressure, radiation to left/right arm, jaw or back, nausea, or diaphoresis. She states her father has a history of blood clots. Denies exogenous estrogen use, lower extremity pain or swelling, recent travel or immobilization, history of PE or DVT, personal history of bleeding or clotting disorders, cough or hemoptysis.     Past Medical History:  Diagnosis Date  . Abnormal Pap smear 10/15/2012   ascus  . Asthma   . Headache(784.0)   . Hypertension   . Varicose veins     Patient Active Problem List   Diagnosis Date Noted  . Disruption of internal operation (surgical) wound 03/08/2013  . Anemia 03/08/2013  . S/P cesarean section 02/25/2013  . S/P tubal ligation 02/25/2013  . Short cervix affecting pregnancy with delivery  02/22/2013  . Supervision of high-risk pregnancy 02/22/2013  . Chronic hypertension complicating or reason for care during pregnancy 01/29/2013    Past Surgical History:  Procedure Laterality Date  . ABDOMINAL HYSTERECTOMY    . CESAREAN SECTION WITH BILATERAL TUBAL LIGATION N/A 02/23/2013   Procedure: CESAREAN SECTION WITH BILATERAL TUBAL LIGATION;  Surgeon: Tereso Newcomer, MD;  Location: [facility];  Service: Obstetrics;  Laterality: N/A;  Primary Cesarean Section Delivery Baby Boy @ 1631, Apgars   . GANGLION CYST EXCISION       OB History    Gravida  5   Para  3   Term  2   Preterm  1   AB  2   Living  3     SAB  2   IAB  0   Ectopic  0   Multiple  0   Live Births  3           Family History  Problem Relation Age of Onset  . Hypertension Mother   . Hypertension Maternal Grandmother     Social History   Tobacco Use  . Smoking status: Former Smoker    Packs/day: 0.25    Types: Cigarettes  . Smokeless tobacco: Never Used  Substance Use Topics  . Alcohol use: Not Currently  . Drug use: No    Home Medications Prior to Admission medications   Medication Sig Start Date End Date Taking? Authorizing Provider  albuterol (PROVENTIL HFA;VENTOLIN  HFA) 108 (90 BASE) MCG/ACT inhaler Inhale 2 puffs into the lungs every 6 (six) hours as needed for wheezing or shortness of breath.    [provider]  albuterol (PROVENTIL) (5 MG/ML) 0.5% nebulizer solution Take 0.5 mLs (2.5 mg total) by nebulization every 6 (six) hours as needed for wheezing or shortness of breath. 04/03/17   Mackuen, Courteney Lyn, MD  amLODipine (NORVASC) 5 MG tablet Take 1 tablet (5 mg total) by mouth daily. 11/22/17   Law, Waylan Boga, PA-C  budesonide-formoterol (SYMBICORT) 80-4.5 MCG/ACT inhaler Inhale 2 puffs into the lungs daily. 04/03/17   Mackuen, Courteney Lyn, MD  cetirizine (ZYRTEC) 10 MG tablet Take 1 tablet (10 mg total) by mouth daily. 1 po q day prn allergies 12/13/17   Rolland Porter,  MD  naproxen (NAPROSYN) 500 MG tablet Take 1 tablet (500 mg total) by mouth 2 (two) times daily. 11/22/17   Law, Waylan Boga, PA-C  predniSONE (DELTASONE) 20 MG tablet Take 1 tablet (20 mg total) by mouth 2 (two) times daily with a meal. 12/13/17   Rolland Porter, MD  promethazine-codeine [facility] WITH CODEINE) 6.25-10 MG/5ML syrup Take 5 mLs by mouth every 4 (four) hours as needed for cough. 12/03/17   Molpus, John, MD    Allergies    Shrimp [shellfish allergy]  Review of Systems   Review of Systems All other systems are reviewed and are negative for acute change except as noted in the HPI.  Physical Exam Updated Vital Signs BP 126/66 (BP Location: Left Arm)   Pulse 65   Temp 98.9 F (37.2 C) (Oral)   Resp 18   Ht 5\' 7"  (1.702 m)   Wt 120.7 kg   LMP 11/17/2017   SpO2 100%   BMI 41.66 kg/m   Physical Exam Vitals and nursing note reviewed.  Constitutional:      General: She is not in acute distress.    Appearance: She is not ill-appearing.  HENT:     Head: Normocephalic and atraumatic.     Right Ear: Tympanic membrane and external ear normal.     Left Ear: Tympanic membrane and external ear normal.     Nose: Nose normal.     Mouth/Throat:     Mouth: Mucous membranes are moist.     Pharynx: Oropharynx is clear.  Eyes:     General: No scleral icterus.       Right eye: No discharge.        Left eye: No discharge.     Extraocular Movements: Extraocular movements intact.     Conjunctiva/sclera: Conjunctivae normal.     Pupils: Pupils are equal, round, and reactive to light.  Neck:     Vascular: No JVD.  Cardiovascular:     Rate and Rhythm: Normal rate and regular rhythm.     Pulses: Normal pulses.          Radial pulses are 2+ on the right side and 2+ on the left side.     Heart sounds: Normal heart sounds.  Pulmonary:     Comments: Lungs clear to auscultation in all fields. Symmetric chest rise. No wheezing, rales, or rhonchi. Chest:     Chest wall: No tenderness.   Abdominal:     Comments: Abdomen is soft, non-distended, and non-tender in all quadrants. No rigidity, no guarding. No peritoneal signs.  Musculoskeletal:        General: Normal range of motion.     Cervical back: Normal range of motion.  Right lower leg: No edema.     Left lower leg: No edema.  Skin:    General: Skin is warm and dry.     Capillary Refill: Capillary refill takes less than 2 seconds.  Neurological:     Mental Status: She is oriented to person, place, and time.     GCS: GCS eye subscore is 4. GCS verbal subscore is 5. GCS motor subscore is 6.     Comments: Fluent speech, no facial droop.  Psychiatric:        Behavior: Behavior normal.     ED Results / Procedures / Treatments   Labs (all labs ordered are listed, but only abnormal results are displayed) Labs Reviewed  BASIC METABOLIC PANEL - Abnormal; Notable for the following components:      Result Value   Potassium 3.4 (*)    BUN 23 (*)    Creatinine, Ser 1.06 (*)    Calcium 8.5 (*)    All other components within normal limits  CBC  TROPONIN I (HIGH SENSITIVITY)  TROPONIN I (HIGH SENSITIVITY)    EKG EKG Interpretation  Date/Time:  Monday October 16 2020 18:04:28 EST Ventricular Rate:  78 PR Interval:  172 QRS Duration: 90 QT Interval:  372 QTC Calculation: 424 R Axis:   19 Text Interpretation: Normal sinus rhythm Normal ECG NSR, no acute ischemic changes Confirmed by Coralee Pesa [phone]) on 10/16/2020 10:59:58 PM   Radiology DG Chest Portable 1 View  Result Date: 10/16/2020 CLINICAL DATA:  Chest pain for several weeks EXAM: PORTABLE CHEST 1 VIEW COMPARISON:  09/10/2015 FINDINGS: The heart size and mediastinal contours are within normal limits. Both lungs are clear. The visualized skeletal structures are unremarkable. IMPRESSION: No active disease. Electronically Signed   By: Alcide Clever M.D.   On: 10/16/2020 19:03    Procedures Procedures   Medications Ordered in ED Medications  alum &  mag hydroxide-simeth (MAALOX/MYLANTA) 200-200-20 MG/5ML suspension 30 mL (30 mLs Oral Given 10/16/20 2345)    And  lidocaine (XYLOCAINE) 2 % viscous mouth solution 15 mL (15 mLs Oral Given 10/16/20 2345)    ED Course  I have reviewed the triage vital signs and the nursing notes.  Pertinent labs & imaging results that were available during my care of the patient were reviewed by me and considered in my medical decision making (see chart for details).    MDM Rules/Calculators/A&P                          History provided by patient with additional history obtained from chart review.    Patient presents to the emergency department with chest pain. Patient nontoxic appearing, in no apparent distress, vitals without significant abnormality. Fairly benign physical exam. DDX: ACS, pulmonary embolism, dissection, pneumothorax, effusion, infiltrate, arrhythmia, anemia, electrolyte derangement, MSK. Evaluation initiated with labs, EKG, and CXR. Patient on cardiac monitor.   Work-up in the ER unremarkable. Labs reviewed, no leukocytosis, anemia, or significant electrolyte abnormality. She did have slightly elevated creatinine 1.06 with a BUN of 23. Prior to compare this to his 3 years ago and creatinine was 0.9. CXR without infiltrate, effusion, pneumothorax, or fracture/dislocation. GI cocktail given with symptoms improvement.  Low risk heart score of 1, EKG without obvious ischemia, delta troponin negative, doubt ACS. Patient is low risk wells, PERC negative, doubt pulmonary embolism. Pain is not a tearing sensation, symmetric pulses, no widening of mediastinum on CXR, doubt dissection. Cardiac monitor reviewed, no  notable arrhythmias or tachycardia. Symptoms could be suggestive of GERD based on history and presentation. Patient has appeared hemodynamically stable throughout ER visit and appears safe for discharge with close PCP/cardiology follow up. I discussed results, treatment plan, need for PCP  follow-up, and return precautions with the patient. Recommend she have creatinine rechecked by PCP when following up. Provided opportunity for questions, patient confirmed understanding and is in agreement with plan.    Portions of this note were generated with Scientist, clinical (histocompatibility and immunogenetics). Dictation errors may occur despite best attempts at proofreading.  Final Clinical Impression(s) / ED Diagnoses Final diagnoses:  Atypical chest pain    Rx / DC Orders ED Discharge Orders    None       Shanon Ace, PA-C 10/17/20 0013    Rozelle Logan, DO 10/17/20 2254

## 2020-10-16 NOTE — ED Triage Notes (Signed)
Pt c/o CP x "weeks"-denies fever/flu sx-NAD-steady gait

## 2020-10-17 NOTE — Discharge Instructions (Addendum)
-  It is possible your symptoms are being caused by heartburn. I have included information about this in your discharge paperwork in about what foods to avoid and things he can try for your symptoms.  You should try over-the-counter Tums when you are having heartburn and see if that helps.  -Follow-up with your primary care doctor for further evaluation of your chest pain if it continues.  Return to the emergency department for any new or worsening symptoms.

## 2022-09-21 ENCOUNTER — Emergency Department (HOSPITAL_BASED_OUTPATIENT_CLINIC_OR_DEPARTMENT_OTHER)
Admission: EM | Admit: 2022-09-21 | Discharge: 2022-09-21 | Disposition: A | Discharge status: Home / Self Care | Payer: Medicaid Other | Attending: Emergency Medicine | Admitting: Emergency Medicine

## 2022-09-21 ENCOUNTER — Other Ambulatory Visit: Payer: Self-pay

## 2022-09-21 ENCOUNTER — Encounter (HOSPITAL_BASED_OUTPATIENT_CLINIC_OR_DEPARTMENT_OTHER): Payer: Self-pay | Admitting: Emergency Medicine

## 2022-09-21 DIAGNOSIS — R519 Headache, unspecified: Secondary | ICD-10-CM | POA: Insufficient documentation

## 2022-09-21 MED ORDER — PROCHLORPERAZINE EDISYLATE 10 MG/2ML IJ SOLN
10.0000 mg | Freq: Once | INTRAMUSCULAR | Status: AC
  Administered 2022-09-21: 10 mg via INTRAMUSCULAR
  Filled 2022-09-21: qty 2

## 2022-09-21 MED ORDER — DEXAMETHASONE 4 MG PO TABS
10.0000 mg | ORAL_TABLET | Freq: Once | ORAL | Status: AC
  Administered 2022-09-21: 10 mg via ORAL
  Filled 2022-09-21: qty 3

## 2022-09-21 NOTE — Discharge Instructions (Addendum)
Please follow up with neuro in the office.  Let your PCP know about your visit.  I  I would have you hold off on taking your Excedrin or Motrin for at least the next couple days.  I have you switch to Tylenol as needed for headache.

## 2022-09-21 NOTE — ED Provider Notes (Signed)
[facility] EMERGENCY DEPARTMENT Provider Note   CSN: [id number] Arrival date & time: 09/21/22  1308     History  Chief Complaint  Patient presents with   Headache    Alexandra Murphy is a 47 y.o. female.  47 yo F with a chief complaints of recurrent headaches.  This has been an off-and-on issue for her.  She tells me that she gets that off and on tends to last for variable lengths of time.  She had it last as long as 3 months in a row.  This time it has been going on for about a month.  Diffuse allover headache.  Throbbing.  No obvious inciting or relieving factor.  Has been taking Motrin and Excedrin fairly frequently and try and help her with this.  Denies cough congestion or fever.  She has been sleeping with a fan on her face and she is not sure if that has been causing her issues.  She denies one-sided numbness or weakness denies difficulty speech or swallowing.   Headache      Home Medications Prior to Admission medications   Medication Sig Start Date End Date Taking? Authorizing Provider  albuterol (PROVENTIL HFA;VENTOLIN HFA) 108 (90 BASE) MCG/ACT inhaler Inhale 2 puffs into the lungs every 6 (six) hours as needed for wheezing or shortness of breath.    [provider]  albuterol (PROVENTIL) (5 MG/ML) 0.5% nebulizer solution Take 0.5 mLs (2.5 mg total) by nebulization every 6 (six) hours as needed for wheezing or shortness of breath. 04/03/17   Mackuen, Courteney Lyn, MD  amLODipine (NORVASC) 5 MG tablet Take 1 tablet (5 mg total) by mouth daily. 11/22/17   Law, Waylan Boga, PA-C  budesonide-formoterol (SYMBICORT) 80-4.5 MCG/ACT inhaler Inhale 2 puffs into the lungs daily. 04/03/17   Mackuen, Courteney Lyn, MD  cetirizine (ZYRTEC) 10 MG tablet Take 1 tablet (10 mg total) by mouth daily. 1 po q day prn allergies 12/13/17   Rolland Porter, MD  naproxen (NAPROSYN) 500 MG tablet Take 1 tablet (500 mg total) by mouth 2 (two) times daily. 11/22/17   Law, Waylan Boga, PA-C   predniSONE (DELTASONE) 20 MG tablet Take 1 tablet (20 mg total) by mouth 2 (two) times daily with a meal. 12/13/17   Rolland Porter, MD  promethazine-codeine [facility] WITH CODEINE) 6.25-10 MG/5ML syrup Take 5 mLs by mouth every 4 (four) hours as needed for cough. 12/03/17   Molpus, Jonny Ruiz, MD      Allergies    Shrimp [shellfish allergy]    Review of Systems   Review of Systems  Neurological:  Positive for headaches.    Physical Exam Updated Vital Signs BP 137/70 (BP Location: Right Arm)   Pulse 91   Temp 97.9 F (36.6 C) (Oral)   Resp 18   Ht 5\' 8"  (1.727 m)   Wt 127 kg   LMP 11/17/2017   SpO2 100%   BMI 42.57 kg/m  Physical Exam Vitals and nursing note reviewed.  Constitutional:      General: She is not in acute distress.    Appearance: She is well-developed. She is not diaphoretic.  HENT:     Head: Normocephalic and atraumatic.  Eyes:     Pupils: Pupils are equal, round, and reactive to light.  Cardiovascular:     Rate and Rhythm: Normal rate and regular rhythm.     Heart sounds: No murmur heard.    No friction rub. No gallop.  Pulmonary:     Effort:  Pulmonary effort is normal.     Breath sounds: No wheezing or rales.  Abdominal:     General: There is no distension.     Palpations: Abdomen is soft.     Tenderness: There is no abdominal tenderness.  Musculoskeletal:        General: No tenderness.     Cervical back: Normal range of motion and neck supple.  Skin:    General: Skin is warm and dry.  Neurological:     Mental Status: She is alert and oriented to person, place, and time.     GCS: GCS eye subscore is 4. GCS verbal subscore is 5. GCS motor subscore is 6.     Cranial Nerves: Cranial nerves 2-12 are intact.     Sensory: Sensation is intact.     Motor: Motor function is intact.     Coordination: Coordination is intact.     Gait: Gait is intact.     Comments: Benign neurologic exam.  Ambulates without issue.  Psychiatric:        Behavior: Behavior normal.      ED Results / Procedures / Treatments   Labs (all labs ordered are listed, but only abnormal results are displayed) Labs Reviewed - No data to display  EKG None  Radiology No results found.  Procedures Procedures    Medications Ordered in ED Medications  prochlorperazine (COMPAZINE) injection 10 mg (10 mg Intramuscular Given 09/21/22 1530)  dexamethasone (DECADRON) tablet 10 mg (10 mg Oral Given 09/21/22 1529)    ED Course/ Medical Decision Making/ A&P                           Medical Decision Making Risk Prescription drug management.   47 yo F with a chief complaint of a headache.  This been off and on for years.  Tends last for weeks to months at a time.  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  She seems to take significant amounts of ibuprofen and/or Excedrin to try and help her.  I had a long discussion with her about this.  She has a benign neurologic exam.  No red flags.  Will give her headache cocktail here.  Given neurology follow-up.  3:59 PM:  I have discussed the diagnosis/risks/treatment options with the patient.  Evaluation and diagnostic testing in the emergency department does not suggest an emergent condition requiring admission or immediate intervention beyond what has been performed at this time.  They will follow up with PCP, neuro. We also discussed returning to the ED immediately if new or worsening sx occur. We discussed the sx which are most concerning (e.g., sudden worsening pain, fever, inability to tolerate by mouth) that necessitate immediate return. Medications administered to the patient during their visit and any new prescriptions provided to the patient are listed below.  Medications given during this visit Medications  prochlorperazine (COMPAZINE) injection 10 mg (10 mg Intramuscular Given 09/21/22 1530)  dexamethasone (DECADRON) tablet 10 mg (10 mg Oral Given 09/21/22 1529)     The patient appears reasonably  screen and/or stabilized for discharge and I doubt any other medical condition or other [facility] requiring further screening, evaluation, or treatment in the ED at this time prior to discharge.          Final Clinical Impression(s) / ED Diagnoses Final diagnoses:  Bad headache    Rx / DC Orders ED Discharge Orders  Ordered    Ambulatory referral to Neurology       Comments: Headache syndrome   09/21/22 1535              Melene Plan, [place] 09/21/22 1559

## 2022-09-21 NOTE — ED Triage Notes (Signed)
Pt c/o HA x 1 mo; taking BP meds as directed; saw PCP and they are trying to adjust HTN meds

## 2022-09-24 NOTE — Progress Notes (Addendum)
Referring:  Melene Plan, DO [address] [place],  [place] [address]  PCP: Laurena Bering, DO  Neurology was asked to evaluate Alexandra Murphy, a 46 year old female for a chief complaint of headaches.  Our recommendations of care will be communicated by shared medical record.    CC:  headaches  History provided from self  HPI:  Medical co-morbidities: HTN, asthma  The patient presents for evaluation of headaches. She has had headaches for >10 years, but they have been worsening in severity over the past few months. She has headaches nearly every day and they are now so severe that they are waking her up from sleep. She will occasionally go a full month without a headache, but then can have several months where she has a daily headache. Headaches are associated with photophobia and visual aura. She does snore at night and has occasionally woken up gasping for air. States she had a sleep study 15 years ago but has not been tested for OSA recently.  She takes Tylenol and Excedrin nearly every day. These are not very effective at alleviating her headache. She is very anxious about an intracranial process causing her headaches.   Headache History: Onset: >10 years ago Triggers: wakes up with headaches Aura: blurry, flickering stars in vision Location: bifrontal, occipital Quality/Description: throbbing Associated Symptoms:  Photophobia: yes  Phonophobia: no  Nausea: no Worse with activity?: yes Duration of headaches: several hours  Migraine days per month: 30 Headache free days per month: 0  Current Treatment: Abortive Tylenol Excedrin  Preventative none  Prior Therapies                                 Rescue: Tylenol Excedrin  Prevention: Metoprolol 50 mg daily Lisinopril - swelling Topamax - lack of efficacy   LABS: CBC    Component Value Date/Time   WBC 6.6 10/16/2020 1817   RBC 4.33 10/16/2020 1817   HGB 12.4 10/16/2020 1817   HCT 36.7 10/16/2020 1817    PLT 285 10/16/2020 1817   MCV 84.8 10/16/2020 1817   MCH 28.6 10/16/2020 1817   MCHC 33.8 10/16/2020 1817   RDW 15.2 10/16/2020 1817   LYMPHSABS 0.9 12/09/2016 1414   MONOABS 0.7 12/09/2016 1414   EOSABS 0.0 12/09/2016 1414   BASOSABS 0.0 12/09/2016 1414      Latest Ref Rng & Units 10/16/2020    6:17 PM 12/09/2016    2:14 PM 03/04/2013    5:18 PM  CMP  Glucose 70 - 99 mg/dL 88  89  88   BUN 6 - 20 mg/dL 23  17  10    Creatinine 0.44 - 1.00 mg/dL  7.82  9.56   Sodium 135 - 145 mmol/L 137  134  136   Potassium 3.5 - 5.1 mmol/L 3.4  3.5  3.9   Chloride 98 - 111 mmol/L 101  102  101   CO2 22 - 32 mmol/L 27  24  25    Calcium 8.9 - 10.3 mg/dL 8.5  8.3  8.4   Total Protein 6.5 - 8.1 g/dL  7.2  6.8   Total Bilirubin 0.3 - 1.2 mg/dL  0.3  0.3   Alkaline Phos 38 - 126 U/L  68  82   AST 15 - 41 U/L  30  12   ALT 14 - 54 U/L  21  11      IMAGING:  none   Current Outpatient Medications on File Prior to Visit  Medication Sig Dispense Refill   albuterol (PROVENTIL HFA;VENTOLIN HFA) 108 (90 BASE) MCG/ACT inhaler Inhale 2 puffs into the lungs every 6 (six) hours as needed for wheezing or shortness of breath.     amLODipine (NORVASC) 5 MG tablet Take 1 tablet (5 mg total) by mouth daily. 30 tablet 0   aspirin-acetaminophen-caffeine (EXCEDRIN MIGRAINE) 250-250-65 MG tablet Take 1-2 tablets by mouth every 6 (six) hours as needed.     montelukast (SINGULAIR) 10 MG tablet Take 10 mg by mouth daily.     potassium chloride SA (KLOR-CON M) 20 MEQ tablet Take 20 mEq by mouth 2 (two) times daily.     hydrochlorothiazide (HYDRODIURIL) 25 MG tablet Take 25 mg by mouth daily. (Patient not taking: Reported on 09/25/2022)     metoprolol succinate (TOPROL-XL) 50 MG 24 hr tablet Take 50 mg by mouth daily. (Patient not taking: Reported on 09/25/2022)     No current facility-administered medications on file prior to visit.     Allergies: Allergies  Allergen Reactions   Fish Allergy Other (See  Comments) and Swelling    Pt reports eyes itching.   Shrimp [Shellfish Allergy] Itching    Lip swelling   Lisinopril Swelling    Family History: Migraine or other headaches in the family:  no Aneurysms in a first degree relative:  no Brain tumors in the family:  no Other neurological illness in the family:   no  Past Medical History: Past Medical History:  Diagnosis Date   Abnormal Pap smear 10/15/2012   [place]   Asthma    Headache(784.0)    Hypertension    Varicose veins     Past Surgical History Past Surgical History:  Procedure Laterality Date   ABDOMINAL HYSTERECTOMY     CESAREAN SECTION WITH BILATERAL TUBAL LIGATION N/A 02/23/2013   Procedure: CESAREAN SECTION WITH BILATERAL TUBAL LIGATION;  Surgeon: Osborne Oman, MD;  Location: [facility];  Service: Obstetrics;  Laterality: N/A;  Primary Cesarean Section Delivery Baby Boy @ 9629, Apgars    GANGLION CYST EXCISION      Social History: Social History   Tobacco Use   Smoking status: Former    Packs/day: 0.25    Types: Cigarettes   Smokeless tobacco: Never  Substance Use Topics   Alcohol use: Not Currently   Drug use: No    ROS: Negative for fevers, chills. Positive for headaches. All other systems reviewed and negative unless stated otherwise in HPI.   Physical Exam:   Vital Signs: BP (!) 151/96   Pulse 72   Ht 5\' 8"  (1.727 m)   Wt 283 lb (128.4 kg)   LMP 11/17/2017   BMI 43.03 kg/m  GENERAL: well appearing,in no acute distress,alert SKIN:  Color, texture, turgor normal. No rashes or lesions HEAD:  Normocephalic/atraumatic. CV:  RRR RESP: Normal respiratory effort MSK: no tenderness to palpation over occiput, neck, or shoulders  NEUROLOGICAL: Mental Status: Alert, oriented to person, place and time,Follows commands Cranial Nerves: PERRL, visual fields intact to confrontation, extraocular movements intact, facial sensation intact, no facial droop or ptosis, hearing grossly intact, no  dysarthria Motor: muscle strength 5/5 both upper and lower extremities,no drift, normal tone Reflexes: 2+ throughout Sensation: intact to light touch all 4 extremities Coordination: Finger-to- nose-finger intact bilaterally Gait: normal-based   IMPRESSION: 47 year old female with a history of HTN and asthma who presents for evaluation of daily headaches. Will order brain  MRI as headaches have started to wake her up from sleep. She has failed multiple oral preventive medications at this point. Will start Emgality for migraine prevention. Imitrex started for rescue. Counseled on limiting rescue medications to avoid rebound headaches. If no cause for headaches found on MRI would consider re-evaluation for OSA given morning headaches with associated snoring and gasping for air at night.  PLAN: -MRI brain -Prevention: Start Emgality 120 mg monthly -Rescue: Start Imitrex 50 mg PRN -Next steps: consider sleep study   I spent a total of 26 minutes chart reviewing and counseling the patient. Headache education was done. Discussed treatment options including preventive and acute medications. Discussed medication overuse headache and to limit use of acute treatments to no more than 2 days/week or 10 days/month. Discussed medication side effects, adverse reactions and drug interactions. Written educational materials and patient instructions outlining all of the above were given.  Follow-up: 8 months   Ocie Doyne, MD 09/25/2022   10:59 AM

## 2022-09-25 ENCOUNTER — Ambulatory Visit (INDEPENDENT_AMBULATORY_CARE_PROVIDER_SITE_OTHER): Payer: Medicaid Other | Admitting: Psychiatry

## 2022-09-25 ENCOUNTER — Encounter: Payer: Self-pay | Admitting: Psychiatry

## 2022-09-25 VITALS — BP 151/96 | HR 72 | Ht 68.0 in | Wt 283.0 lb

## 2022-09-25 DIAGNOSIS — G43E19 Chronic migraine with aura, intractable, without status migrainosus: Secondary | ICD-10-CM | POA: Diagnosis not present

## 2022-09-25 DIAGNOSIS — R519 Headache, unspecified: Secondary | ICD-10-CM | POA: Diagnosis not present

## 2022-09-25 MED ORDER — SUMATRIPTAN SUCCINATE 50 MG PO TABS: 50.0000 mg | ORAL_TABLET | ORAL | 6 refills | Status: AC | PRN

## 2022-09-25 MED ORDER — EMGALITY 120 MG/ML ~~LOC~~ SOAJ: 2.0000 | Freq: Once | SUBCUTANEOUS | 0 refills | Status: AC

## 2022-09-25 MED ORDER — EMGALITY 120 MG/ML ~~LOC~~ SOAJ: 1.0000 | SUBCUTANEOUS | 6 refills | Status: AC

## 2022-10-16 ENCOUNTER — Telehealth: Payer: Self-pay | Admitting: Psychiatry

## 2022-10-16 NOTE — Telephone Encounter (Signed)
Updated my note to say they are worsening in severity and waking her up from sleep, thanks

## 2022-10-16 NOTE — Telephone Encounter (Signed)
Her insurance is requiring her notes to say: chronic headaches are they changing in character/pattern severity? please provide details of the change

## 2022-10-22 ENCOUNTER — Ambulatory Visit: Payer: Medicaid Other | Admitting: Psychiatry

## 2022-10-22 NOTE — Telephone Encounter (Signed)
New notes have been sent, waiting on approval.

## 2022-10-22 NOTE — Telephone Encounter (Signed)
Alexandra Murphy: [id number] exp. 10/14/2022-12/13/2022 sent to GI [phone]

## 2022-11-01 ENCOUNTER — Emergency Department (HOSPITAL_BASED_OUTPATIENT_CLINIC_OR_DEPARTMENT_OTHER)
Admission: EM | Admit: 2022-11-01 | Discharge: 2022-11-01 | Disposition: A | Discharge status: Home / Self Care | Payer: Medicaid Other | Attending: Emergency Medicine | Admitting: Emergency Medicine

## 2022-11-01 ENCOUNTER — Other Ambulatory Visit: Payer: Self-pay

## 2022-11-01 ENCOUNTER — Encounter (HOSPITAL_BASED_OUTPATIENT_CLINIC_OR_DEPARTMENT_OTHER): Payer: Self-pay | Admitting: Emergency Medicine

## 2022-11-01 DIAGNOSIS — Z7982 Long term (current) use of aspirin: Secondary | ICD-10-CM | POA: Diagnosis not present

## 2022-11-01 DIAGNOSIS — T7840XA Allergy, unspecified, initial encounter: Secondary | ICD-10-CM | POA: Diagnosis present

## 2022-11-01 MED ORDER — ACETAMINOPHEN 325 MG PO TABS
650.0000 mg | ORAL_TABLET | Freq: Once | ORAL | Status: AC
  Administered 2022-11-01: 650 mg via ORAL
  Filled 2022-11-01: qty 2

## 2022-11-01 MED ORDER — PREDNISONE 50 MG PO TABS
60.0000 mg | ORAL_TABLET | Freq: Once | ORAL | Status: AC
  Administered 2022-11-01: 60 mg via ORAL
  Filled 2022-11-01: qty 1

## 2022-11-01 MED ORDER — PREDNISONE 20 MG PO TABS: 20.0000 mg | ORAL_TABLET | Freq: Every day | ORAL | 0 refills | Status: AC

## 2022-11-01 MED ORDER — EPINEPHRINE 0.3 MG/0.3ML IJ SOAJ: 0.3000 mg | INTRAMUSCULAR | 0 refills | Status: AC | PRN

## 2022-11-01 MED ORDER — EPINEPHRINE 0.3 MG/0.3ML IJ SOAJ
0.3000 mg | Freq: Once | INTRAMUSCULAR | Status: AC
  Administered 2022-11-01: 0.3 mg via INTRAMUSCULAR
  Filled 2022-11-01: qty 0.3

## 2022-11-01 NOTE — ED Triage Notes (Signed)
Pt reports possible allergic reaction to shell fish. States she was at a [employer] and is concerned they cooked shellfish on the same grill as her food. Pt currently c/o shob, throat itching and headache. Denies throat swelling, difficulty breathing.

## 2022-11-01 NOTE — Discharge Instructions (Signed)
Take next dose of prednisone tomorrow.  I prescribed you EpiPen's.  Use as needed.

## 2022-11-01 NOTE — ED Provider Notes (Signed)
[place] [facility] EMERGENCY DEPARTMENT [facility] Provider Note   CSN: [id number] Arrival date & time: 11/01/22  1946     History  Chief Complaint  Patient presents with   Allergic Reaction    Alexandra Murphy is a 47 y.o. female.  Patient feels scratchiness in the back of her throat, little short of breath after possible shellfish exposure at restaurant just prior to arrival.  Does not have EpiPen.  History of allergies in the past.  She has not been able to take Benadryl either.  Denies any chest pain, facial swelling.  The history is provided by the patient.       Home Medications Prior to Admission medications   Medication Sig Start Date End Date Taking? Authorizing Provider  EPINEPHrine 0.3 mg/0.3 mL IJ SOAJ injection Inject 0.3 mg into the muscle as needed for anaphylaxis. 11/01/22  Yes Russell Quinney, DO  predniSONE (DELTASONE) 20 MG tablet Take 1 tablet (20 mg total) by mouth daily for 2 days. 11/01/22 11/03/22 Yes Nakeita Styles, DO  albuterol (PROVENTIL HFA;VENTOLIN HFA) 108 (90 BASE) MCG/ACT inhaler Inhale 2 puffs into the lungs every 6 (six) hours as needed for wheezing or shortness of breath.    [provider]  amLODipine (NORVASC) 5 MG tablet Take 1 tablet (5 mg total) by mouth daily. 11/22/17   Law, Bea Graff, PA-C  aspirin-acetaminophen-caffeine (EXCEDRIN MIGRAINE) [phone] MG tablet Take 1-2 tablets by mouth every 6 (six) hours as needed.    [provider]  Galcanezumab-gnlm (EMGALITY) 120 MG/ML SOAJ Inject 1 Pen into the skin every 30 (thirty) days. 09/25/22   Genia Harold, MD  hydrochlorothiazide (HYDRODIURIL) 25 MG tablet Take 25 mg by mouth daily. Patient not taking: Reported on 09/25/2022 04/30/18   [provider]  metoprolol succinate (TOPROL-XL) 50 MG 24 hr tablet Take 50 mg by mouth daily. Patient not taking: Reported on 09/25/2022    [provider]  montelukast (SINGULAIR) 10 MG tablet Take 10 mg by mouth  daily.    [provider]  potassium chloride SA (KLOR-CON M) 20 MEQ tablet Take 20 mEq by mouth 2 (two) times daily. 05/29/22   [provider]  SUMAtriptan (IMITREX) 50 MG tablet Take 1 tablet (50 mg total) by mouth every 2 (two) hours as needed for migraine. May repeat in 2 hours if headache persists or recurs. 09/25/22   Genia Harold, MD      Allergies    Fish allergy, Shrimp [shellfish allergy], and Lisinopril    Review of Systems   Review of Systems  Physical Exam Updated Vital Signs BP (!) 163/104   Pulse (!) 108   Temp 98.9 F (37.2 C) (Oral)   Resp (!) 25   Ht 5' 8"$  (1.727 m)   Wt 127 kg   LMP 11/17/2017   SpO2 100%   BMI 42.57 kg/m  Physical Exam Vitals and nursing note reviewed.  Constitutional:      General: She is not in acute distress.    Appearance: She is well-developed. She is not ill-appearing.  HENT:     Head: Normocephalic and atraumatic.     Mouth/Throat:     Mouth: Mucous membranes are moist.     Comments: No obvious swelling to the tongue, posterior oropharynx, lips or face Eyes:     Extraocular Movements: Extraocular movements intact.     Conjunctiva/sclera: Conjunctivae normal.     Pupils: Pupils are equal, round, and reactive to light.  Cardiovascular:  Rate and Rhythm: Normal rate and regular rhythm.     Pulses: Normal pulses.     Heart sounds: Normal heart sounds. No murmur heard. Pulmonary:     Effort: Pulmonary effort is normal. No respiratory distress.     Breath sounds: Wheezing present.     Comments: Mild wheeze Abdominal:     Palpations: Abdomen is soft.     Tenderness: There is no abdominal tenderness.  Musculoskeletal:     Cervical back: Neck supple.  Skin:    General: Skin is warm and dry.     Capillary Refill: Capillary refill takes less than 2 seconds.  Neurological:     General: No focal deficit present.     Mental Status: She is alert.     ED Results / Procedures / Treatments   Labs (all labs  ordered are listed, but only abnormal results are displayed) Labs Reviewed - No data to display  EKG None  Radiology No results found.  Procedures Procedures    Medications Ordered in ED Medications  acetaminophen (TYLENOL) tablet 650 mg (650 mg Oral Given 11/01/22 2020)  predniSONE (DELTASONE) tablet 60 mg (60 mg Oral Given 11/01/22 2020)  EPINEPHrine (EPI-PEN) injection 0.3 mg (0.3 mg Intramuscular Given 11/01/22 2021)    ED Course/ Medical Decision Making/ A&P                             Medical Decision Making Risk OTC drugs. Prescription drug management.   Alexandra Murphy is here with allergic reaction to likely shellfish.  Normal vitals.  No fever.  No major signs of anaphylaxis on exam but she does feel like her throat is closing.  Will conservatively give her dose of epi, prednisone.  Will observe but I suspect that this is more mild allergic process.  Anticipate discharge with steroids and Benadryl for home.  Will refill her EpiPen's.  Patient feeling much better.  She does not want to stay for prolonged observation.  Shared decision was made to have her continue management at home.  Will give her prescription for EpiPen and prednisone.  I did recommend that we watch her longer but she is minimally symptomatic when she first came in and the overall she has capacity make this decision.  Discharge.  This chart was dictated using voice recognition software.  Despite best efforts to proofread,  errors can occur which can change the documentation meaning.         Final Clinical Impression(s) / ED Diagnoses Final diagnoses:  Allergic reaction, initial encounter    Rx / DC Orders ED Discharge Orders          Ordered    EPINEPHrine 0.3 mg/0.3 mL IJ SOAJ injection  As needed        11/01/22 2100    predniSONE (DELTASONE) 20 MG tablet  Daily        11/01/22 2100              Lennice Sites, DO 11/01/22 2101

## 2022-11-13 ENCOUNTER — Inpatient Hospital Stay: Admission: RE | Admit: 2022-11-13 | Payer: Medicaid Other | Source: Ambulatory Visit

## 2023-04-29 ENCOUNTER — Ambulatory Visit: Payer: Medicaid Other | Admitting: Psychiatry

## 2023-04-29 ENCOUNTER — Encounter: Payer: Self-pay | Admitting: Psychiatry

## 2023-04-29 NOTE — Progress Notes (Deleted)
   CC:  headaches  Follow-up Visit  Last visit: 09/25/22  Brief HPI: 47 year old female with a history of HTN and asthma who follows in clinic for migraines.  At her last visit she was started on Emgality for migraine prevention and Imitrex for rescue. MRI brain was ordered.  Interval History: Headaches***Emgality***Imitrex. Sleep***  MRI brain was not completed.  Headache days per month: *** Migraine days per month*** Headache free days per month: ***  Current Headache Regimen: Preventative: *** Abortive: ***   Prior Therapies                                  Rescue: Tylenol Excedrin   Prevention: Metoprolol 50 mg daily Lisinopril - swelling Topamax - lack of efficacy  Physical Exam:   Vital Signs: LMP 11/17/2017  GENERAL:  well appearing, in no acute distress, alert  SKIN:  Color, texture, turgor normal. No rashes or lesions HEAD:  Normocephalic/atraumatic. RESP: normal respiratory effort MSK:  No gross joint deformities.   NEUROLOGICAL: Mental Status: Alert, oriented to person, place and time, Follows commands, and Speech fluent and appropriate. Cranial Nerves: PERRL, face symmetric, no dysarthria, hearing grossly intact Motor: moves all extremities equally Gait: normal-based.  IMPRESSION: ***  PLAN: ***   Follow-up: ***  I spent a total of *** minutes on the date of the service. Headache education was done. Discussed lifestyle modification including increased oral hydration, decreased caffeine, exercise and stress management. Discussed treatment options including preventive and acute medications, natural supplements, and infusion therapy. Discussed medication overuse headache and to limit use of acute treatments to no more than 2 days/week or 10 days/month. Discussed medication side effects, adverse reactions and drug interactions. Written educational materials and patient instructions outlining all of the above were given.  Ocie Doyne, MD

## 2024-09-18 ENCOUNTER — Encounter (HOSPITAL_BASED_OUTPATIENT_CLINIC_OR_DEPARTMENT_OTHER): Payer: Self-pay

## 2024-09-18 ENCOUNTER — Emergency Department (HOSPITAL_BASED_OUTPATIENT_CLINIC_OR_DEPARTMENT_OTHER)
Admission: EM | Admit: 2024-09-18 | Discharge: 2024-09-19 | Disposition: A | Attending: Emergency Medicine | Admitting: Emergency Medicine

## 2024-09-18 ENCOUNTER — Other Ambulatory Visit: Payer: Self-pay

## 2024-09-18 DIAGNOSIS — Z464 Encounter for fitting and adjustment of orthodontic device: Secondary | ICD-10-CM | POA: Insufficient documentation

## 2024-09-18 DIAGNOSIS — Z87891 Personal history of nicotine dependence: Secondary | ICD-10-CM | POA: Diagnosis not present

## 2024-09-18 DIAGNOSIS — J45909 Unspecified asthma, uncomplicated: Secondary | ICD-10-CM | POA: Diagnosis not present

## 2024-09-18 DIAGNOSIS — I10 Essential (primary) hypertension: Secondary | ICD-10-CM | POA: Insufficient documentation

## 2024-09-18 DIAGNOSIS — R198 Other specified symptoms and signs involving the digestive system and abdomen: Secondary | ICD-10-CM

## 2024-09-18 DIAGNOSIS — K089 Disorder of teeth and supporting structures, unspecified: Secondary | ICD-10-CM | POA: Diagnosis present

## 2024-09-18 NOTE — ED Triage Notes (Signed)
 Patient here POV from Home.  Notes brace wire (upper right molar) poking into gum. Has been present for over 1 week. Attempting trimming at home without success.   NAD noted during triage. A&Ox4. GCS 15. Ambulatory.

## 2024-09-19 NOTE — ED Provider Notes (Signed)
 " [facility] Emergency Department Provider Note MRN:  [id number]  Arrival date & time: 09/19/2024     Chief Complaint   Dental Problem   History of Present Illness   Alexandra Murphy is a 49 y.o. year-old female with a history of hypertension presenting to the ED with chief complaint of dental problem.  Patient explains that she recently got braces.  The right upper bracket had a wire that was a bit too long and was sticking out.  This has been causing some irritation within the mouth.  The very back bracket fell off yesterday and now the wire is very long and causing a lot of discomfort, cannot eat.  Wants the excess wire cut off.  Review of Systems  A thorough review of systems was obtained and all systems are negative except as noted in the HPI and PMH.   Patient's Health History    Past Medical History:  Diagnosis Date   Abnormal Pap smear 10/15/2012   [place]   Asthma    Headache(784.0)    Hypertension    Varicose veins     Past Surgical History:  Procedure Laterality Date   ABDOMINAL HYSTERECTOMY     CESAREAN SECTION WITH BILATERAL TUBAL LIGATION N/A 02/23/2013   Procedure: CESAREAN SECTION WITH BILATERAL TUBAL LIGATION;  Surgeon: Gloris DELENA Hugger, MD;  Location: [facility];  Service: Obstetrics;  Laterality: N/A;  Primary Cesarean Section Delivery Baby Boy @ 1631, Apgars    GANGLION CYST EXCISION      Family History  Problem Relation Age of Onset   Hypertension Mother    Hypertension Maternal Grandmother     Social History   Socioeconomic History   Marital status: Single    Spouse name: Not on file   Number of children: Not on file   Years of education: Not on file   Highest education level: Not on file  Occupational History   Not on file  Tobacco Use   Smoking status: Former    Current packs/day: 0.25    Types: Cigarettes   Smokeless tobacco: Never  Substance and Sexual Activity   Alcohol use: Not Currently   Drug use: No   Sexual  activity: Not on file  Other Topics Concern   Not on file  Social History Narrative   Not on file   Social Drivers of Health   Tobacco Use: Medium Risk (09/18/2024)   Patient History    Smoking Tobacco Use: Former    Smokeless Tobacco Use: Never    Passive Exposure: Not on file  Financial Resource Strain: Low Risk (02/17/2024)   Received from [facility]   Overall Financial Resource Strain (CARDIA)    Difficulty of Paying Living Expenses: Not very hard  Food Insecurity: No Food Insecurity (02/17/2024)   Received from [facility]   Epic    Within the past 12 months, you worried that your food would run out before you got the money to buy more.: Never true    Within the past 12 months, the food you bought just didn't last and you didn't have money to get more.: Never true  Transportation Needs: No Transportation Needs (02/17/2024)   Received from [facility] - Transportation    Lack of Transportation (Medical): No    Lack of Transportation (Non-Medical): No  Physical Activity: Not on File (03/10/2023)   Received from [facility]   Physical Activity    Physical Activity: 0  Stress: Not on File (  03/10/2023)   Received from [facility]   Stress    Stress: 0  Social Connections: Not on File (06/15/2023)   Received from [facility]   Social Connections    Connectedness: 0  Intimate Partner Violence: Unknown (12/18/2021)   Received from [facility]   HITS    Physically Hurt: Not on file    Insult or Talk Down To: Not on file    Threaten Physical Harm: Not on file    Scream or Curse: Not on file  Depression (PHQ2-9): Not on file  Alcohol Screen: Not on file  Housing: Low Risk (02/17/2024)   Received from [facility]    In the last 12 months, was there a time when you were not able to pay the mortgage or rent on time?: No    In the past 12 months, how many times have you moved where you were living?: 1    At any time in the past 12 months, were you homeless or living in a shelter  (including now)?: No  Utilities: Not At Risk (02/17/2024)   Received from [facility] Utilities    Threatened with loss of utilities: No  Health Literacy: Not on file     Physical Exam   Vitals:   09/18/24 2316  BP: (!) 137/96  Pulse: 80  Resp: 18  Temp: 98.4 F (36.9 C)  SpO2: 98%    CONSTITUTIONAL: Well-appearing, NAD NEURO/PSYCH:  Alert and oriented x 3, no focal deficits EYES:  eyes equal and reactive ENT/NECK:  no LAD, no JVD CARDIO: Regular rate, well-perfused, normal S1 and S2 PULM:  CTAB no wheezing or rhonchi GI/GU:  non-distended, non-tender MSK/SPINE:  No gross deformities, no edema SKIN:  no rash, atraumatic   *Additional and/or pertinent findings included in MDM below  Diagnostic and Interventional Summary    EKG Interpretation Date/Time:    Ventricular Rate:    PR Interval:    QRS Duration:    QT Interval:    QTC Calculation:   R Axis:      Text Interpretation:         Labs Reviewed - No data to display  No orders to display    Medications - No data to display   Procedures  /  Critical Care .Foreign Body Removal  Date/Time: 09/19/2024 12:10 AM  Performed by: Theadore Ozell HERO, MD Authorized by: Theadore Ozell HERO, MD  Consent: Verbal consent obtained Risks and benefits: risks, benefits and alternatives were discussed Consent given by: patient Patient understanding: patient states understanding of the procedure being performed Imaging studies: imaging studies available Patient identity confirmed: verbally with patient Time out: Immediately prior to procedure a time out was called to verify the correct patient, procedure, equipment, support staff and site/side marked as required. Intake: Mouth.  Sedation: Patient sedated: no  Patient restrained: no Patient cooperative: yes Complexity: complex 1 objects recovered. Objects recovered: dental wire Post-procedure assessment: foreign body removed Patient tolerance: patient tolerated  the procedure well with no immediate complications Comments: Kelly clamp secured to the end of the wire and clamp held by assistant to assure no loss of the wire after cutting.  Excess wire cut with wire cutters.  Patient expresses satisfaction.    ED Course and Medical Decision Making  Initial Impression and Ddx Patient is a bit desperate for relief.  The access wire is actually 1 or 2 cm in length causing irritation and poking to the inside of the mouth.  I made it clear with the patient that this is a dental issue and that I am not a dentist.  She expresses understanding that with any intervention by myself the emergency medicine physician I could cause damage to her braces, make the problem worse, cause injury within the mouth.  She expresses understanding and still wishes that I assist her by trying to trim this excess wire.  Past medical/surgical history that increases complexity of ED encounter: None  Interpretation of Diagnostics Laboratory and/or imaging options to aid in the diagnosis/care of the patient were considered.  After careful history and physical examination, it was determined that there was no indication for diagnostics at this time.  Patient Reassessment and Ultimate Disposition/Management     With a wire cutter the excess wire was removed.  She will follow-up with her dentist.  Discharged home.  Patient management required discussion with the following services or consulting groups:  None  Complexity of Problems Addressed Acute complicated illness or Injury  Additional Data Reviewed and Analyzed Further history obtained from: Further history from spouse/family member  Additional Factors Impacting ED Encounter Risk Minor Procedures  Ozell HERO. Theadore, MD [facility] Emergency Medicine [facility] [email or web address]  Final Clinical Impressions(s) / ED Diagnoses     ICD-10-CM   1. Tooth symptom  R19.8       ED Discharge Orders     None         Discharge Instructions Discussed with and Provided to Patient:    Discharge Instructions      You were evaluated in the Emergency Department and after careful evaluation, we did not find any emergent condition requiring admission or further testing in the hospital.  Your exam/testing today is overall reassuring.  Recommend follow-up with your dentist as we discussed.  Please return to the Emergency Department if you experience any worsening of your condition.   Thank you for allowing us  to be a part of your care.      Theadore Ozell HERO, MD 09/19/24 0011  "

## 2024-09-19 NOTE — Discharge Instructions (Signed)
 You were evaluated in the Emergency Department and after careful evaluation, we did not find any emergent condition requiring admission or further testing in the hospital.  Your exam/testing today is overall reassuring.  Recommend follow-up with your dentist as we discussed.  Please return to the Emergency Department if you experience any worsening of your condition.   Thank you for allowing us  to be a part of your care.
